# Patient Record
Sex: Male | Born: 1980 | Race: White | Hispanic: No | Marital: Married | Smoking: Former smoker
Health system: Southern US, Community
[De-identification: ages and names within clinical notes are randomized; demographics above are authoritative.]

## PROBLEM LIST (undated history)

## (undated) DIAGNOSIS — T4145XA Adverse effect of unspecified anesthetic, initial encounter: Secondary | ICD-10-CM

## (undated) DIAGNOSIS — F329 Major depressive disorder, single episode, unspecified: Secondary | ICD-10-CM

## (undated) DIAGNOSIS — G43909 Migraine, unspecified, not intractable, without status migrainosus: Secondary | ICD-10-CM

## (undated) DIAGNOSIS — F32A Depression, unspecified: Secondary | ICD-10-CM

## (undated) DIAGNOSIS — R6889 Other general symptoms and signs: Secondary | ICD-10-CM

## (undated) DIAGNOSIS — Z87820 Personal history of traumatic brain injury: Secondary | ICD-10-CM

## (undated) DIAGNOSIS — K4090 Unilateral inguinal hernia, without obstruction or gangrene, not specified as recurrent: Secondary | ICD-10-CM

## (undated) DIAGNOSIS — K219 Gastro-esophageal reflux disease without esophagitis: Secondary | ICD-10-CM

## (undated) DIAGNOSIS — F419 Anxiety disorder, unspecified: Secondary | ICD-10-CM

## (undated) DIAGNOSIS — M1990 Unspecified osteoarthritis, unspecified site: Secondary | ICD-10-CM

## (undated) DIAGNOSIS — I10 Essential (primary) hypertension: Secondary | ICD-10-CM

## (undated) DIAGNOSIS — G47419 Narcolepsy without cataplexy: Secondary | ICD-10-CM

## (undated) DIAGNOSIS — T8859XA Other complications of anesthesia, initial encounter: Secondary | ICD-10-CM

## (undated) HISTORY — DX: Gastro-esophageal reflux disease without esophagitis: K21.9

---

## 2003-01-30 ENCOUNTER — Emergency Department (HOSPITAL_COMMUNITY): Admission: EM | Admit: 2003-01-30 | Discharge: 2003-01-31 | Payer: Self-pay | Admitting: Emergency Medicine

## 2003-05-16 ENCOUNTER — Emergency Department (HOSPITAL_COMMUNITY): Admission: EM | Admit: 2003-05-16 | Discharge: 2003-05-16 | Payer: Self-pay | Admitting: Emergency Medicine

## 2004-02-21 ENCOUNTER — Emergency Department (HOSPITAL_COMMUNITY): Admission: EM | Admit: 2004-02-21 | Discharge: 2004-02-21 | Payer: Self-pay | Admitting: Emergency Medicine

## 2004-03-14 ENCOUNTER — Emergency Department (HOSPITAL_COMMUNITY): Admission: EM | Admit: 2004-03-14 | Discharge: 2004-03-15 | Payer: Self-pay | Admitting: Emergency Medicine

## 2004-03-16 ENCOUNTER — Emergency Department (HOSPITAL_COMMUNITY): Admission: EM | Admit: 2004-03-16 | Discharge: 2004-03-16 | Payer: Self-pay | Admitting: Emergency Medicine

## 2004-06-09 ENCOUNTER — Emergency Department (HOSPITAL_COMMUNITY): Admission: EM | Admit: 2004-06-09 | Discharge: 2004-06-09 | Payer: Self-pay | Admitting: Emergency Medicine

## 2006-06-15 HISTORY — PX: CIRCUMCISION: SUR203

## 2007-06-16 HISTORY — PX: UVULOPALATOPHARYNGOPLASTY: SHX827

## 2008-06-15 HISTORY — PX: ANKLE SURGERY: SHX546

## 2014-02-21 ENCOUNTER — Encounter (HOSPITAL_COMMUNITY): Payer: Self-pay | Admitting: Emergency Medicine

## 2014-02-21 ENCOUNTER — Emergency Department (HOSPITAL_COMMUNITY)
Admission: EM | Admit: 2014-02-21 | Discharge: 2014-02-21 | Disposition: A | Discharge status: Home / Self Care | Attending: Emergency Medicine | Admitting: Emergency Medicine

## 2014-02-21 DIAGNOSIS — Z8659 Personal history of other mental and behavioral disorders: Secondary | ICD-10-CM | POA: Insufficient documentation

## 2014-02-21 DIAGNOSIS — J029 Acute pharyngitis, unspecified: Secondary | ICD-10-CM | POA: Diagnosis not present

## 2014-02-21 DIAGNOSIS — I10 Essential (primary) hypertension: Secondary | ICD-10-CM | POA: Insufficient documentation

## 2014-02-21 DIAGNOSIS — Z8669 Personal history of other diseases of the nervous system and sense organs: Secondary | ICD-10-CM | POA: Diagnosis not present

## 2014-02-21 DIAGNOSIS — R6883 Chills (without fever): Secondary | ICD-10-CM | POA: Insufficient documentation

## 2014-02-21 DIAGNOSIS — J069 Acute upper respiratory infection, unspecified: Secondary | ICD-10-CM | POA: Insufficient documentation

## 2014-02-21 DIAGNOSIS — R51 Headache: Secondary | ICD-10-CM | POA: Insufficient documentation

## 2014-02-21 DIAGNOSIS — B9789 Other viral agents as the cause of diseases classified elsewhere: Secondary | ICD-10-CM

## 2014-02-21 HISTORY — DX: Anxiety disorder, unspecified: F41.9

## 2014-02-21 HISTORY — DX: Major depressive disorder, single episode, unspecified: F32.9

## 2014-02-21 HISTORY — DX: Narcolepsy without cataplexy: G47.419

## 2014-02-21 HISTORY — DX: Essential (primary) hypertension: I10

## 2014-02-21 HISTORY — DX: Depression, unspecified: F32.A

## 2014-02-21 LAB — RAPID STREP SCREEN (MED CTR MEBANE ONLY): Streptococcus, Group A Screen (Direct): NEGATIVE

## 2014-02-21 MED ORDER — CETIRIZINE HCL 10 MG PO CAPS: 10.0000 mg | ORAL_CAPSULE | Freq: Every evening | ORAL | Status: DC | PRN

## 2014-02-21 NOTE — ED Provider Notes (Signed)
CSN: [id number]     Arrival date & time 02/21/14  0944 History   First MD Initiated Contact with Patient 02/21/14 1000     Chief Complaint  Patient presents with  . Nasal Congestion  . Sore Throat   Patient is a 33 y.o. male presenting with pharyngitis. The history is provided by the patient. No language interpreter was used.  Sore Throat This is a new problem. The current episode started yesterday. The problem occurs constantly. The problem has been unchanged. Associated symptoms include chills, congestion, coughing, headaches, a sore throat and weakness. Pertinent negatives include no abdominal pain, anorexia, arthralgias, change in bowel habit, chest pain, diaphoresis, fatigue, fever, joint swelling, myalgias, nausea, neck pain, numbness, rash, swollen glands, urinary symptoms, vertigo, visual change or vomiting. Nothing aggravates the symptoms. He has tried nothing for the symptoms. The treatment provided no relief.    Past Medical History  Diagnosis Date  . Hypertension   . Narcolepsy   . Anxiety   . Depression    Past Surgical History  Procedure Laterality Date  . Tonsillectomy    . Ankle surgery     No family history on file. History  Substance Use Topics  . Smoking status: Never Smoker   . Smokeless tobacco: Current User  . Alcohol Use: No    Review of Systems  Constitutional: Positive for chills. Negative for fever, diaphoresis and fatigue.  HENT: Positive for congestion and sore throat. Negative for ear discharge, ear pain and facial swelling.   Respiratory: Positive for cough.   Cardiovascular: Negative for chest pain.  Gastrointestinal: Negative for nausea, vomiting, abdominal pain, anorexia and change in bowel habit.  Musculoskeletal: Negative for arthralgias, joint swelling, myalgias and neck pain.  Skin: Negative for rash.  Neurological: Positive for weakness and headaches. Negative for vertigo and numbness.  All other systems reviewed and are  negative.     Allergies  Review of patient's allergies indicates no known allergies.  Home Medications   Prior to Admission medications   Medication Sig Start Date End Date Taking? Authorizing Provider  Cetirizine HCl (ZYRTEC ALLERGY) 10 MG CAPS Take 1 capsule (10 mg total) by mouth at bedtime as needed (for congestion). 02/21/14   Magdalyn Arenivas A Forcucci, PA-C   BP 132/80  Pulse 64  Temp(Src) 98.1 F (36.7 C) (Oral)  Resp 18  SpO2 100% Physical Exam  Nursing note and vitals reviewed. Constitutional: He is oriented to person, place, and time. He appears well-developed and well-nourished. No distress.  HENT:  Head: Normocephalic and atraumatic.  Mouth/Throat: Uvula is midline and mucous membranes are normal. No oral lesions. No trismus in the jaw. No uvula swelling. Posterior oropharyngeal erythema present. No oropharyngeal exudate, posterior oropharyngeal edema or tonsillar abscesses.  Prior tonsilectomy  Eyes: Conjunctivae and EOM are normal. Pupils are equal, round, and reactive to light. No scleral icterus.  Neck: Normal range of motion. Neck supple. No JVD present. No thyromegaly present.  Cardiovascular: Normal rate, regular rhythm, normal heart sounds and intact distal pulses.  Exam reveals no gallop and no friction rub.   No murmur heard. Pulmonary/Chest: Breath sounds normal. No respiratory distress. He has no wheezes. He has no rales. He exhibits no tenderness.  Musculoskeletal: Normal range of motion.  Lymphadenopathy:    He has no cervical adenopathy.  Neurological: He is alert and oriented to person, place, and time.  Skin: Skin is warm and dry. He is not diaphoretic.  Psychiatric: He has a normal mood and affect. His  behavior is normal. Judgment and thought content normal.    ED Course  Procedures (including critical care time) Labs Review Labs Reviewed  RAPID STREP SCREEN  CULTURE, GROUP A STREP    Imaging Review No results found.   EKG  Interpretation None      MDM   Final diagnoses:  Viral URI with cough  Pharyngitis   Patient is a 33 y.o. Male who presents to the ED with sore throat, nasal congestion and cough.  Physical examination unremarkable at this time and lungs are clear to auscultation.  Patient is afebrile.  Doubt PNA at this time.  Suspect this is viral URI with cough at this time.  Will treat symptomatically with saline in the nose, tylenol or ibuprofen as needed for sore throat and myalgias and will given prescription for zyrtec to help with congestion.  Patient to return for PNA symptoms.  Patient is stable for discharge at this time.  Patient states understanding and agreement.      Eben Burow, PA-C 02/21/14 1035

## 2014-02-21 NOTE — ED Notes (Signed)
Per pt, states nasal congestion, sore throat since last night

## 2014-02-21 NOTE — ED Provider Notes (Signed)
Medical screening examination/treatment/procedure(s) were performed by non-physician practitioner and as supervising physician I was immediately available for consultation/collaboration.  Audree Camel, MD 02/21/14 [phone]

## 2014-02-21 NOTE — Discharge Instructions (Signed)
Upper Respiratory Infection, Adult An upper respiratory infection (URI) is also known as the common cold. It is often caused by a type of germ (virus). Colds are easily spread (contagious). You can pass it to others by kissing, coughing, sneezing, or drinking out of the same glass. Usually, you get better in 1 or 2 weeks.  HOME CARE   Only take medicine as told by your doctor.  Use a warm mist humidifier or breathe in steam from a hot shower.  Drink enough water and fluids to keep your pee (urine) clear or pale yellow.  Get plenty of rest.  Return to work when your temperature is back to normal or as told by your doctor. You may use a face mask and wash your hands to stop your cold from spreading. GET HELP RIGHT AWAY IF:   After the first few days, you feel you are getting worse.  You have questions about your medicine.  You have chills, shortness of breath, or brown or red spit (mucus).  You have yellow or brown snot (nasal discharge) or pain in the face, especially when you bend forward.  You have a fever, puffy (swollen) neck, pain when you swallow, or white spots in the back of your throat.  You have a bad headache, ear pain, sinus pain, or chest pain.  You have a high-pitched whistling sound when you breathe in and out (wheezing).  You have a lasting cough or cough up blood.  You have sore muscles or a stiff neck. MAKE SURE YOU:   Understand these instructions.  Will watch your condition.  Will get help right away if you are not doing well or get worse. Document Released: 11/18/2007 Document Revised: 08/24/2011 Document Reviewed: 09/06/2013 Exit[employer] Patient Information 2015 [employer]. This information is not intended to replace advice given to you by your health care provider. Make sure you discuss any questions you have with your health care provider.  

## 2014-02-23 LAB — CULTURE, GROUP A STREP

## 2014-03-08 ENCOUNTER — Emergency Department (HOSPITAL_COMMUNITY)
Admission: EM | Admit: 2014-03-08 | Discharge: 2014-03-08 | Disposition: A | Discharge status: Home / Self Care | Attending: Emergency Medicine | Admitting: Emergency Medicine

## 2014-03-08 ENCOUNTER — Encounter (HOSPITAL_COMMUNITY): Payer: Self-pay | Admitting: Emergency Medicine

## 2014-03-08 ENCOUNTER — Emergency Department (HOSPITAL_COMMUNITY)

## 2014-03-08 DIAGNOSIS — F329 Major depressive disorder, single episode, unspecified: Secondary | ICD-10-CM | POA: Insufficient documentation

## 2014-03-08 DIAGNOSIS — K2990 Gastroduodenitis, unspecified, without bleeding: Secondary | ICD-10-CM | POA: Diagnosis not present

## 2014-03-08 DIAGNOSIS — K2970 Gastritis, unspecified, without bleeding: Secondary | ICD-10-CM

## 2014-03-08 DIAGNOSIS — Z79899 Other long term (current) drug therapy: Secondary | ICD-10-CM | POA: Insufficient documentation

## 2014-03-08 DIAGNOSIS — I10 Essential (primary) hypertension: Secondary | ICD-10-CM | POA: Insufficient documentation

## 2014-03-08 DIAGNOSIS — F3289 Other specified depressive episodes: Secondary | ICD-10-CM | POA: Diagnosis not present

## 2014-03-08 DIAGNOSIS — F411 Generalized anxiety disorder: Secondary | ICD-10-CM | POA: Diagnosis not present

## 2014-03-08 DIAGNOSIS — Z8669 Personal history of other diseases of the nervous system and sense organs: Secondary | ICD-10-CM | POA: Diagnosis not present

## 2014-03-08 DIAGNOSIS — R1013 Epigastric pain: Secondary | ICD-10-CM | POA: Diagnosis present

## 2014-03-08 LAB — COMPREHENSIVE METABOLIC PANEL
ALBUMIN: 3.9 g/dL (ref 3.5–5.2)
ALK PHOS: 133 U/L — AB (ref 39–117)
ALT: 50 U/L (ref 0–53)
ANION GAP: 8 (ref 5–15)
AST: 32 U/L (ref 0–37)
BUN: 15 mg/dL (ref 6–23)
CALCIUM: 8.8 mg/dL (ref 8.4–10.5)
CO2: 30 mEq/L (ref 19–32)
Chloride: 102 mEq/L (ref 96–112)
Creatinine, Ser: 1.25 mg/dL (ref 0.50–1.35)
GFR calc non Af Amer: 75 mL/min — ABNORMAL LOW (ref >90)
GFR, EST AFRICAN AMERICAN: 87 mL/min — AB (ref >90)
Glucose, Bld: 98 mg/dL (ref 70–99)
Potassium: 4.4 mEq/L (ref 3.7–5.3)
SODIUM: 140 meq/L (ref 137–147)
Total Bilirubin: <0.2 mg/dL — ABNORMAL LOW (ref 0.3–1.2)
Total Protein: 7.1 g/dL (ref 6.0–8.3)

## 2014-03-08 LAB — CBC WITH DIFFERENTIAL/PLATELET
BASOS ABS: 0 10*3/uL (ref 0.0–0.1)
Basophils Relative: 0 % (ref 0–1)
EOS PCT: 0 % (ref 0–5)
Eosinophils Absolute: 0 10*3/uL (ref 0.0–0.7)
HCT: 46.6 % (ref 39.0–52.0)
HEMOGLOBIN: 16 g/dL (ref 13.0–17.0)
LYMPHS ABS: 3.1 10*3/uL (ref 0.7–4.0)
Lymphocytes Relative: 42 % (ref 12–46)
MCH: 30.9 pg (ref 26.0–34.0)
MCHC: 34.3 g/dL (ref 30.0–36.0)
MCV: 90 fL (ref 78.0–100.0)
MONOS PCT: 8 % (ref 3–12)
Monocytes Absolute: 0.6 10*3/uL (ref 0.1–1.0)
NEUTROS PCT: 50 % (ref 43–77)
Neutro Abs: 3.7 10*3/uL (ref 1.7–7.7)
PLATELETS: 194 10*3/uL (ref 150–400)
RBC: 5.18 MIL/uL (ref 4.22–5.81)
RDW: 12.3 % (ref 11.5–15.5)
WBC: 7.3 10*3/uL (ref 4.0–10.5)

## 2014-03-08 LAB — LIPASE, BLOOD: LIPASE: 21 U/L (ref 11–59)

## 2014-03-08 LAB — I-STAT TROPONIN, ED: TROPONIN I, POC: 0.01 ng/mL (ref 0.00–0.08)

## 2014-03-08 MED ORDER — ONDANSETRON 4 MG PO TBDP
8.0000 mg | ORAL_TABLET | Freq: Once | ORAL | Status: AC
  Administered 2014-03-08: 8 mg via ORAL
  Filled 2014-03-08: qty 2

## 2014-03-08 MED ORDER — OXYCODONE-ACETAMINOPHEN 5-325 MG PO TABS
1.0000 | ORAL_TABLET | Freq: Once | ORAL | Status: AC
  Administered 2014-03-08: 1 via ORAL
  Filled 2014-03-08: qty 1

## 2014-03-08 MED ORDER — HYDROMORPHONE HCL 1 MG/ML IJ SOLN
1.0000 mg | Freq: Once | INTRAMUSCULAR | Status: AC
  Administered 2014-03-08: 1 mg via INTRAVENOUS
  Filled 2014-03-08: qty 1

## 2014-03-08 MED ORDER — RANITIDINE HCL 150 MG PO TABS: 150.0000 mg | ORAL_TABLET | Freq: Two times a day (BID) | ORAL | Status: DC

## 2014-03-08 MED ORDER — OMEPRAZOLE 20 MG PO CPDR: 20.0000 mg | DELAYED_RELEASE_CAPSULE | Freq: Every day | ORAL | Status: DC

## 2014-03-08 MED ORDER — ONDANSETRON HCL 4 MG/2ML IJ SOLN
4.0000 mg | Freq: Once | INTRAMUSCULAR | Status: AC
  Administered 2014-03-08: 4 mg via INTRAVENOUS
  Filled 2014-03-08: qty 2

## 2014-03-08 MED ORDER — HYDROCODONE-ACETAMINOPHEN 5-325 MG PO TABS: 1.0000 | ORAL_TABLET | Freq: Four times a day (QID) | ORAL | Status: DC | PRN

## 2014-03-08 NOTE — Discharge Instructions (Signed)

## 2014-03-08 NOTE — ED Notes (Signed)
Pt c/o gallbladder attack, sts last time he was seen for they told him if it happened again he needs to get it removed. Pt c/o pain to RUQ abd and nausea. Denies v/d. Nad, skin warm and dry, resp e/u.

## 2014-03-08 NOTE — ED Notes (Signed)
Asked pt to provide urine specimen pt stated he could not provide one at this time.  

## 2014-03-08 NOTE — ED Provider Notes (Signed)
CSN: [id number]     Arrival date & time 03/08/14  1722 History   First MD Initiated Contact with Patient 03/08/14 2016     No chief complaint on file.    (Consider location/radiation/quality/duration/timing/severity/associated sxs/prior Treatment) Patient is a 33 y.o. male presenting with abdominal pain. The history is provided by the patient.  Abdominal Pain Pain location:  Epigastric and RUQ Pain quality: aching and fullness   Pain radiates to:  Does not radiate Pain severity:  Mild Onset quality:  Gradual Duration:  4 hours Timing:  Constant Progression:  Unchanged Chronicity:  Recurrent (hx of biliary colic in past, states feels similar) Context: eating (pt staes started after he ate some spicy sausage for dinner)   Relieved by:  Nothing Worsened by:  Nothing tried Ineffective treatments:  None tried Associated symptoms: nausea   Associated symptoms: no anorexia, no belching, no chest pain, no constipation, no cough, no diarrhea, no dysuria, no fever, no hematemesis, no hematochezia, no hematuria, no melena, no shortness of breath and no vomiting   Risk factors: obesity   Risk factors: no alcohol abuse, not elderly and has not had multiple surgeries     Past Medical History  Diagnosis Date  . Hypertension   . Narcolepsy   . Anxiety   . Depression    Past Surgical History  Procedure Laterality Date  . Tonsillectomy    . Ankle surgery     No family history on file. History  Substance Use Topics  . Smoking status: Never Smoker   . Smokeless tobacco: Current User  . Alcohol Use: No    Review of Systems  Constitutional: Negative for fever, activity change and appetite change.  HENT: Negative for congestion and rhinorrhea.   Eyes: Negative for discharge and itching.  Respiratory: Negative for cough, shortness of breath and wheezing.   Cardiovascular: Negative for chest pain.  Gastrointestinal: Positive for nausea and abdominal pain. Negative for vomiting,  diarrhea, constipation, melena, hematochezia, anorexia and hematemesis.  Genitourinary: Negative for dysuria, hematuria, decreased urine volume and difficulty urinating.  Skin: Negative for rash and wound.  Neurological: Negative for syncope, weakness and numbness.  All other systems reviewed and are negative.     Allergies  Review of patient's allergies indicates no known allergies.  Home Medications   Prior to Admission medications   Medication Sig Start Date End Date Taking? Authorizing Provider  methylphenidate 27 MG PO CR tablet Take 27 mg by mouth 2 (two) times daily.   Yes Historical Provider, MD  propranolol (INDERAL) 10 MG tablet Take 10 mg by mouth 3 (three) times daily.   Yes Historical Provider, MD  Sodium Oxybate (XYREM) 500 MG/ML SOLN Take 3,750 mg by mouth at bedtime.    Yes Historical Provider, MD  telmisartan-hydrochlorothiazide (MICARDIS HCT) 40-12.5 MG per tablet Take 1 tablet by mouth every morning.   Yes Historical Provider, MD  venlafaxine XR (EFFEXOR-XR) 75 MG 24 hr capsule Take 75 mg by mouth daily with breakfast.   Yes Historical Provider, MD  HYDROcodone-acetaminophen (NORCO) 5-325 MG per tablet Take 1 tablet by mouth every 6 (six) hours as needed for moderate pain. 03/08/14   Pilar Jarvis, MD  methylphenidate (RITALIN) 5 MG tablet Take 5 mg by mouth 3 (three) times daily as needed (For narcolepsy.).     Historical Provider, MD  omeprazole (PRILOSEC) 20 MG capsule Take 1 capsule (20 mg total) by mouth daily. 03/08/14   Pilar Jarvis, MD  ranitidine (ZANTAC) 150 MG tablet Take 1  tablet (150 mg total) by mouth 2 (two) times daily. 03/08/14   Pilar Jarvis, MD   BP 128/77  Pulse 66  Temp(Src) 98.2 F (36.8 C) (Oral)  Resp 18  Ht  (1.88 m)  Wt 240 lb (108.863 kg)  BMI 30.80 kg/m2  SpO2 95% Physical Exam  Vitals reviewed. Constitutional: He is oriented to person, place, and time. He appears well-developed and well-nourished. No distress.  HENT:  Head:  Normocephalic and atraumatic.  Mouth/Throat: Oropharynx is clear and moist. No oropharyngeal exudate.  Eyes: Conjunctivae and EOM are normal. Pupils are equal, round, and reactive to light. Right eye exhibits no discharge. Left eye exhibits no discharge. No scleral icterus.  Neck: Normal range of motion. Neck supple.  Cardiovascular: Normal rate, regular rhythm, normal heart sounds and intact distal pulses.  Exam reveals no gallop and no friction rub.   No murmur heard. Pulmonary/Chest: Effort normal and breath sounds normal. No respiratory distress. He has no wheezes. He has no rales.  Abdominal: Soft. He exhibits no distension and no mass. There is tenderness (RUQ ttp, neg murphy, no peritonitis, no lower abd ttp).  Musculoskeletal: Normal range of motion.  Neurological: He is alert and oriented to person, place, and time. No cranial nerve deficit. He exhibits normal muscle tone. Coordination normal.  Skin: Skin is warm. No rash noted. He is not diaphoretic.    ED Course  Procedures (including critical care time) Labs Review Labs Reviewed  COMPREHENSIVE METABOLIC PANEL - Abnormal; Notable for the following:    Alkaline Phosphatase 133 (*)    Total Bilirubin <0.2 (*)    GFR calc non Af Amer 75 (*)    GFR calc Af Amer 87 (*)    All other components within normal limits  CBC WITH DIFFERENTIAL  LIPASE, BLOOD  URINALYSIS, ROUTINE W REFLEX MICROSCOPIC  I-STAT TROPOININ, ED    Imaging Review US Abdomen Limited  03/08/2014   CLINICAL DATA:  Right upper quadrant pain.  EXAM: US ABDOMEN LIMITED - RIGHT UPPER QUADRANT  COMPARISON:  None.  FINDINGS: Gallbladder:  No gallstones or wall thickening visualized. No sonographic Murphy sign noted.  Common bile duct:  Diameter: 4.3 mm  Liver:  No focal lesion identified. Diffusely increased echogenicity present, suggestive of steatosis.  IMPRESSION: 1. Normal sonographic evaluation of the gallbladder. 2. Hepatic steatosis.   Electronically Signed   By:  Rise Mu M.D.   On: 03/08/2014 22:50     EKG Interpretation None      MDM   MDM: 33 y.o. WM w/ PMHx of HTN, gallstones w/ cc: of abd pain. Started just PTA after eating sausage. Hx of similar pain with his biliary colic. No fever. Nausea, no vomiting, no diarrhea. Here AFVSS, well appearing. NAD. Abd soft, but RUQ TTP without peritonitis. No lower abd ttp. Story c/w biliary dz. Will check RUQ u/s. Labs WNL. No fever. Not clinicially cholecystitis. Pain meds given. Will check u/s. U/s no evidence of stones or cholecystitis. Pt states he has hx of stones, but none visualized today. Possibly passed. No signs of choledocolithiasis on labs. Likely gastritis. Will treat with Zantac and Omeprazole. Rx given. Short course of Norco as well. Pt became upset when told his results. He states he is in unbearable pain. Of note, his HR is 60, he is resting comfortably, talking to calmly while laying on the bed. I explained to him that his test and presentation today are not indicative of a life threatening emergency. I explained my  wish to treat him with H2 blockers and PPIs with a short course of Norco. I explained that he may need endoscopy at a later time if sxs persist. He told me to "fuckin call someone in tonight to do it" which I explained would not be an option. He told me that we "fuckin wasted his time" stating we weren't doing anything for him. I answered all his questions to the best of my ability but explained that we would use medical therapy and counseled him on decreasing risk factors, including smoking, EtOH, and food triggers. Discharged.   Final diagnoses:  Gastritis   New Prescriptions   HYDROCODONE-ACETAMINOPHEN (NORCO) 5-325 MG PER TABLET    Take 1 tablet by mouth every 6 (six) hours as needed for moderate pain.   OMEPRAZOLE (PRILOSEC) 20 MG CAPSULE    Take 1 capsule (20 mg total) by mouth daily.   RANITIDINE (ZANTAC) 150 MG TABLET    Take 1 tablet (150 mg total) by mouth 2 (two)  times daily.   [facility] EMERGENCY DEPARTMENT [id number] [place] [address] [phone] Schedule an appointment as soon as possible for a visit As needed  Discharged   Pilar Jarvis, MD 03/08/14 2952  Pilar Jarvis, MD 03/08/14 [phone]

## 2014-03-10 NOTE — ED Provider Notes (Signed)
I saw and evaluated the patient, reviewed the resident's note and I agree with the findings and plan.   EKG Interpretation None      Ronald Carpenter is a 33 y.o. male hx of gallstones here with RUQ pain after eating sausage. Mild RUQ tenderness. No vomiting. I was unable to visualize gallbladder on bedside US. Labs showed slightly elevated Alk phos. US showed no obvious gallstones. Likely gastritis. Resident explained it to him and we set up him with GI. He was unhappy with the diagnosis but is stable for d/c.    Wandra Arthurs, MD 03/10/14 [phone]

## 2014-03-15 HISTORY — PX: LAPAROSCOPIC CHOLECYSTECTOMY: SUR755

## 2015-01-28 ENCOUNTER — Emergency Department (HOSPITAL_COMMUNITY)
Admission: EM | Admit: 2015-01-28 | Discharge: 2015-01-28 | Disposition: A | Discharge status: Home / Self Care | Attending: Emergency Medicine | Admitting: Emergency Medicine

## 2015-01-28 ENCOUNTER — Encounter (HOSPITAL_COMMUNITY): Payer: Self-pay | Admitting: Emergency Medicine

## 2015-01-28 ENCOUNTER — Emergency Department (HOSPITAL_COMMUNITY)

## 2015-01-28 DIAGNOSIS — F329 Major depressive disorder, single episode, unspecified: Secondary | ICD-10-CM | POA: Diagnosis not present

## 2015-01-28 DIAGNOSIS — W2209XA Striking against other stationary object, initial encounter: Secondary | ICD-10-CM | POA: Diagnosis not present

## 2015-01-28 DIAGNOSIS — Y9289 Other specified places as the place of occurrence of the external cause: Secondary | ICD-10-CM | POA: Insufficient documentation

## 2015-01-28 DIAGNOSIS — Z79899 Other long term (current) drug therapy: Secondary | ICD-10-CM | POA: Diagnosis not present

## 2015-01-28 DIAGNOSIS — S299XXA Unspecified injury of thorax, initial encounter: Secondary | ICD-10-CM | POA: Diagnosis present

## 2015-01-28 DIAGNOSIS — F419 Anxiety disorder, unspecified: Secondary | ICD-10-CM | POA: Insufficient documentation

## 2015-01-28 DIAGNOSIS — S20211A Contusion of right front wall of thorax, initial encounter: Secondary | ICD-10-CM | POA: Diagnosis not present

## 2015-01-28 DIAGNOSIS — I10 Essential (primary) hypertension: Secondary | ICD-10-CM | POA: Insufficient documentation

## 2015-01-28 DIAGNOSIS — Z8669 Personal history of other diseases of the nervous system and sense organs: Secondary | ICD-10-CM | POA: Insufficient documentation

## 2015-01-28 DIAGNOSIS — Y9389 Activity, other specified: Secondary | ICD-10-CM | POA: Diagnosis not present

## 2015-01-28 DIAGNOSIS — Y998 Other external cause status: Secondary | ICD-10-CM | POA: Diagnosis not present

## 2015-01-28 NOTE — ED Notes (Signed)
Was moving furniture, fell, the dresser corner landed on the right side of pt chest. Pt states the only problem he has with breathing is taking a deep breath because it hurts to do so. Lung sounds clear in all fields. No obvious bruising/swelling/signs of bleeding to anterior chest/back/stomach.

## 2015-01-28 NOTE — Discharge Instructions (Signed)
Lease follow-up with your primary care provider for further evaluation and management. Please monitor for new or worsening signs or symptoms.Please use ibuprofen or Tylenol as needed for the pain. Please use ice

## 2015-01-28 NOTE — ED Provider Notes (Signed)
CSN: [id number]     Arrival date & time 01/28/15  1357 History  This chart was scribed for non-physician practitioner, Eyvonne Mechanic, PA-C, working with Raeford Razor, MD by Charline Bills, ED Scribe. This patient was seen in room WTR6/WTR6 and the patient's care was started at 3:00 PM.   Chief Complaint  Patient presents with  . Chest Injury   The history is provided by the patient. No language interpreter was used.   HPI Comments: Ronald Carpenter is a 34 y.o. male, with a h/o HTN, who presents to the Emergency Department complaining of sudden onset of constant right rib pain onset earlier today. 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 Pt reports constant, moderate pain that is exacerbated with palpation, movement, sneezing and deep breaths. He further reports SOB with deep inspiration but none at baseline.    [facility]  Past Medical History  Diagnosis Date  . Hypertension   . Narcolepsy   . Anxiety   . Depression    Past Surgical History  Procedure Laterality Date  . Tonsillectomy    . Ankle surgery     History reviewed. No pertinent family history. Social History  Substance Use Topics  . Smoking status: Never Smoker   . Smokeless tobacco: Current User  . Alcohol Use: No    Review of Systems  All other systems reviewed and are negative.  Allergies  Review of patient's allergies indicates no known allergies.  Home Medications   Prior to Admission medications   Medication Sig Start Date End Date Taking? Authorizing Provider  HYDROcodone-acetaminophen (NORCO) 5-325 MG per tablet Take 1 tablet by mouth every 6 (six) hours as needed for moderate pain. 03/08/14   Pilar Jarvis, MD  methylphenidate (RITALIN) 5 MG tablet Take 5 mg by mouth 3 (three) times daily as needed (For narcolepsy.).     Historical Provider, MD  methylphenidate 27 MG PO CR tablet Take 27 mg by mouth 2 (two) times daily.    Historical Provider, MD   omeprazole (PRILOSEC) 20 MG capsule Take 1 capsule (20 mg total) by mouth daily. 03/08/14   Pilar Jarvis, MD  propranolol (INDERAL) 10 MG tablet Take 10 mg by mouth 3 (three) times daily.    Historical Provider, MD  ranitidine (ZANTAC) 150 MG tablet Take 1 tablet (150 mg total) by mouth 2 (two) times daily. 03/08/14   Pilar Jarvis, MD  Sodium Oxybate (XYREM) 500 MG/ML SOLN Take 3,750 mg by mouth at bedtime.     Historical Provider, MD  telmisartan-hydrochlorothiazide (MICARDIS HCT) 40-12.5 MG per tablet Take 1 tablet by mouth every morning.    Historical Provider, MD  venlafaxine XR (EFFEXOR-XR) 75 MG 24 hr capsule Take 75 mg by mouth daily with breakfast.    Historical Provider, MD   BP 148/81 mmHg  Pulse 69  Temp(Src) 98.3 F (36.8 C) (Oral)  Resp 18  SpO2 98% Physical Exam  Constitutional: He is oriented to person, place, and time. He appears well-developed and well-nourished. No distress.  No obvious distress. Resting comfortably.   HENT:  Head: Normocephalic and atraumatic.  Eyes: Conjunctivae and EOM are normal.  Neck: Neck supple. No tracheal deviation present.  Cardiovascular: Normal rate, regular rhythm and normal heart sounds.   Pulmonary/Chest: Effort normal and breath sounds normal. No respiratory distress.  No obvious deformity. Tenderness to palpation of the R anterior chest wall and ribs. No crepitus or signs of infection. Lung  sounds clear and present.  Abdominal: Soft. There is no tenderness.  Musculoskeletal: Normal range of motion.  Neurological: He is alert and oriented to person, place, and time.  Skin: Skin is warm and dry.  Psychiatric: He has a normal mood and affect. His behavior is normal.  Nursing note and vitals reviewed.  ED Course  Procedures (including critical care time) 3:05 PM-Discussed treatment plan with pt at bedside and pt agreed to plan.     Labs Review Labs Reviewed - No data to display  Imaging Review Dg Ribs Unilateral W/chest  Right  01/28/2015   CLINICAL DATA:  Status post fall down stairs 5 days ago. Right rib pain. Initial encounter.  EXAM: RIGHT RIBS AND CHEST - 3+ VIEW  COMPARISON:  None.  FINDINGS: The lungs are clear. No pneumothorax or pleural effusion. There is no fracture. Heart size is normal.  IMPRESSION: Negative exam.   Electronically Signed   By: Drusilla Kanner M.D.   On: 01/28/2015 15:27    EKG Interpretation None      MDM   Final diagnoses:  Rib contusion, right, initial encounter    Labs:  Imaging: DG Ribs Unilateral w/Chest Right- no significant findings  Consults:  Therapeutics:  Discharge Meds:   Assessment/Plan:patient presents with rib pain after reportedly having a dresser landed on his ribs. He has no obvious signs of trauma to the ribs, lung sounds are clear, x-ray showed no fracture. Oxygen at 98%, full lung expansion. He will be instructedto use ibuprofen or Tylenol as needed for pain, monitor for new or worsening signs or symptoms, return immediately if any present. Patient given strict return precautions, instructed follow-up with his primary care provider for further evaluation if symptoms persist.  I personally performed the services described in this documentation, which was scribed in my presence. The recorded information has been reviewed and is accurate.  Eyvonne Mechanic, PA-C 01/28/15 1850  Raeford Razor, MD 01/31/15 [phone]

## 2015-02-06 ENCOUNTER — Encounter (HOSPITAL_COMMUNITY): Payer: Self-pay | Admitting: Emergency Medicine

## 2015-02-06 ENCOUNTER — Emergency Department (HOSPITAL_COMMUNITY)
Admission: EM | Admit: 2015-02-06 | Discharge: 2015-02-06 | Disposition: A | Discharge status: Home / Self Care | Attending: Emergency Medicine | Admitting: Emergency Medicine

## 2015-02-06 DIAGNOSIS — I10 Essential (primary) hypertension: Secondary | ICD-10-CM | POA: Insufficient documentation

## 2015-02-06 DIAGNOSIS — Y9389 Activity, other specified: Secondary | ICD-10-CM | POA: Diagnosis not present

## 2015-02-06 DIAGNOSIS — Y998 Other external cause status: Secondary | ICD-10-CM | POA: Diagnosis not present

## 2015-02-06 DIAGNOSIS — F419 Anxiety disorder, unspecified: Secondary | ICD-10-CM | POA: Diagnosis not present

## 2015-02-06 DIAGNOSIS — S3992XA Unspecified injury of lower back, initial encounter: Secondary | ICD-10-CM | POA: Diagnosis not present

## 2015-02-06 DIAGNOSIS — Z8619 Personal history of other infectious and parasitic diseases: Secondary | ICD-10-CM | POA: Insufficient documentation

## 2015-02-06 DIAGNOSIS — S59911A Unspecified injury of right forearm, initial encounter: Secondary | ICD-10-CM | POA: Diagnosis not present

## 2015-02-06 DIAGNOSIS — Y92481 Parking lot as the place of occurrence of the external cause: Secondary | ICD-10-CM | POA: Diagnosis not present

## 2015-02-06 DIAGNOSIS — F329 Major depressive disorder, single episode, unspecified: Secondary | ICD-10-CM | POA: Insufficient documentation

## 2015-02-06 DIAGNOSIS — Z79899 Other long term (current) drug therapy: Secondary | ICD-10-CM | POA: Insufficient documentation

## 2015-02-06 DIAGNOSIS — M5489 Other dorsalgia: Secondary | ICD-10-CM

## 2015-02-06 DIAGNOSIS — M79601 Pain in right arm: Secondary | ICD-10-CM

## 2015-02-06 MED ORDER — IBUPROFEN 800 MG PO TABS: 800.0000 mg | ORAL_TABLET | Freq: Three times a day (TID) | ORAL | Status: DC | PRN

## 2015-02-06 MED ORDER — CYCLOBENZAPRINE HCL 10 MG PO TABS: 10.0000 mg | ORAL_TABLET | Freq: Three times a day (TID) | ORAL | Status: DC | PRN
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
## 2015-02-28 ENCOUNTER — Ambulatory Visit (INDEPENDENT_AMBULATORY_CARE_PROVIDER_SITE_OTHER): Admitting: Family Medicine

## 2015-02-28 VITALS — BP 112/76 | HR 81 | Temp 98.1°F | Resp 18 | Ht 74.0 in | Wt 231.2 lb

## 2015-02-28 DIAGNOSIS — S00411A Abrasion of right ear, initial encounter: Secondary | ICD-10-CM

## 2015-02-28 DIAGNOSIS — H6502 Acute serous otitis media, left ear: Secondary | ICD-10-CM

## 2015-02-28 DIAGNOSIS — H6121 Impacted cerumen, right ear: Secondary | ICD-10-CM

## 2015-02-28 DIAGNOSIS — H9201 Otalgia, right ear: Secondary | ICD-10-CM | POA: Diagnosis not present

## 2015-02-28 MED ORDER — NEOMYCIN-POLYMYXIN-HC 3.5-10000-1 OT SOLN: 4.0000 [drp] | Freq: Three times a day (TID) | OTIC | Status: DC

## 2015-02-28 MED ORDER — AMOXICILLIN 500 MG PO CAPS
500.0000 mg | ORAL_CAPSULE | Freq: Three times a day (TID) | ORAL | Status: DC
Start: 2015-02-28 — End: 2016-01-06

## 2015-02-28 NOTE — Progress Notes (Addendum)
Subjective:  This chart was scribed for Ronald Staggers, MD by Ronald Carpenter, medical scribe at [facility].The patient was seen in exam room 02 and the patient's care was started at 11:56 AM.   Patient ID: Ronald Carpenter, male    DOB: 08/17/1980, 34 y.o.   MRN: [id number] Chief Complaint  Patient presents with  . Ear Pain    right ear, woke up to ear feeling full,tried to clean it and now feels pain   HPI HPI Comments: Ronald Carpenter is a 34 y.o. male who presents to [facility] complaining of right ear pressure with associated hearing loss and pain. Tried cleaning the ear. Sore throat this morning. No fever, congestion, and cough. Works at a [employer], general practice. Does not ear plugs, did not get water in his ear. Traveled to [place] this past Sunday. No ear drops. No hx of ear infections. Served in [employer] for 11 years. Retired last year.  There are no active problems to display for this patient.  Past Medical History  Diagnosis Date  . Hypertension   . Narcolepsy   . Anxiety   . Depression   . GERD (gastroesophageal reflux disease)    Past Surgical History  Procedure Laterality Date  . Tonsillectomy    . Ankle surgery    . Cholecystectomy     No Known Allergies Prior to Admission medications   Medication Sig Start Date End Date Taking? Authorizing Provider  methylphenidate (RITALIN LA) 30 MG 24 hr capsule Take 30 mg by mouth every morning.   Yes Historical Provider, MD  methylphenidate (RITALIN) 5 MG tablet Take 5 mg by mouth 3 (three) times daily as needed (For narcolepsy.).    Yes Historical Provider, MD  propranolol (INDERAL) 10 MG tablet Take 10 mg by mouth 3 (three) times daily.   Yes Historical Provider, MD  telmisartan-hydrochlorothiazide (MICARDIS HCT) 40-12.5 MG per tablet Take 1 tablet by mouth every morning.   Yes Historical Provider, MD  venlafaxine XR (EFFEXOR-XR) 75 MG 24 hr capsule Take 75 mg by mouth daily  with breakfast.   Yes Historical Provider, MD  cyclobenzaprine (FLEXERIL) 10 MG tablet Take 1 tablet (10 mg total) by mouth 3 (three) times daily as needed for muscle spasms. Patient not taking: Reported on 02/28/2015 02/06/15   Trixie Dredge, PA-C  HYDROcodone-acetaminophen [facility]) 5-325 MG per tablet Take 1 tablet by mouth every 6 (six) hours as needed for moderate pain. Patient not taking: Reported on 02/28/2015 03/08/14   Pilar Jarvis, MD  ibuprofen (ADVIL,MOTRIN) 800 MG tablet Take 1 tablet (800 mg total) by mouth every 8 (eight) hours as needed for mild pain or moderate pain. Patient not taking: Reported on 02/28/2015 02/06/15   Trixie Dredge, PA-C  methylphenidate 27 MG PO CR tablet Take 27 mg by mouth 2 (two) times daily.    Historical Provider, MD  omeprazole (PRILOSEC) 20 MG capsule Take 1 capsule (20 mg total) by mouth daily. Patient not taking: Reported on 02/28/2015 03/08/14   Pilar Jarvis, MD  ranitidine (ZANTAC) 150 MG tablet Take 1 tablet (150 mg total) by mouth 2 (two) times daily. Patient not taking: Reported on 02/28/2015 03/08/14   Pilar Jarvis, MD  Sodium Oxybate (XYREM) 500 MG/ML SOLN Take 3,750 mg by mouth at bedtime.     Historical Provider, MD   Social History   Social History  . Marital Status: Married    Spouse Name: N/A  . Number of Children:  N/A  . Years of Education: N/A   Occupational History  . Not on file.   Social History Main Topics  . Smoking status: Never Smoker   . Smokeless tobacco: Current User  . Alcohol Use: No  . Drug Use: No  . Sexual Activity: Not on file   Other Topics Concern  . Not on file   Social History Narrative   Review of Systems  Constitutional: Negative for fever and chills.  HENT: Positive for ear pain, hearing loss and sore throat. Negative for congestion.   Respiratory: Negative for cough.       Objective:  BP 112/76 mmHg  Pulse 81  Temp(Src) 98.1 F (36.7 C) (Oral)  Resp 18  Ht  (1.88 m)  Wt 231 lb 3.2 oz (104.872 kg)   BMI 29.67 kg/m2  SpO2 98% Physical Exam  Constitutional: He is oriented to person, place, and time. He appears well-developed and well-nourished. No distress.  HENT:  Head: Normocephalic and atraumatic.  Left ear: Cerumen partially obstructed to half the canal. TM pearly grey. No pain with ear motion. Right ear: Minimal discomfort with motion of the pinna. Abrasion of the external canal just inside. Cerumen nearly blocking the canal, with slight opening superiorly Minimal erythema of the distal canal without exudate.  Eyes: Pupils are equal, round, and reactive to light.  Neck: Normal range of motion.  Cardiovascular: Normal rate and regular rhythm.   Pulmonary/Chest: Effort normal. No respiratory distress.  Musculoskeletal: Normal range of motion.  Neurological: He is alert and oriented to person, place, and time.  Skin: Skin is warm and dry.  Psychiatric: He has a normal mood and affect. His behavior is normal.  Nursing note and vitals reviewed.   12:24 PM Status post lavage of right ear, previous small area of abrasion noted on the exterior canal with dried blood, otherwise canal appears clear but slightly erythematous. Tympanic membrane erythematous, with some injection superiorly, clear fluid behind the TM inferiorly.       Assessment & Plan:   Ronald Carpenter is a 34 y.o. male Otalgia of right ear - Plan: neomycin-polymyxin-hydrocortisone (CORTISPORIN) otic solution, amoxicillin (AMOXIL) 500 MG capsule  Cerumen impaction, right  Acute serous otitis media of left ear, recurrence not specified - Plan: amoxicillin (AMOXIL) 500 MG capsule  Abrasion of ear canal, right, initial encounter - Plan: neomycin-polymyxin-hydrocortisone (CORTISPORIN) otic solution  Initial cerumen impaction, with secondary irritation/abrasion of canal after home treatment likely. After lavage, canal was clear, except for abraded area. TM was injected/erythematous with some fluid noted. This may be  serous otitis versus early otitis media.  -Cortisporin otic drops for external/canal irritation versus early infection causing pain.  -If pain not improving the next few days, can start amoxicillin 500 mg 3 times a day for otitis media, but suspected serous otitis at this time.  -RTC precautions    Meds ordered this encounter  Medications  . methylphenidate (RITALIN LA) 30 MG 24 hr capsule    Sig: Take 30 mg by mouth every morning.  . neomycin-polymyxin-hydrocortisone (CORTISPORIN) otic solution    Sig: Place 4 drops into the right ear 3 (three) times daily. For 7 days.    Dispense:  10 mL    Refill:  0  . amoxicillin (AMOXIL) 500 MG capsule    Sig: Take 1 capsule (500 mg total) by mouth 3 (three) times daily.    Dispense:  30 capsule    Refill:  0   Patient Instructions  I  suspect that your ear was blocked, then a slight abrasion or irritation of the canal that may have caused the soreness. There is some redness of your eardrum, but this can sometimes be from fluid behind the ear with traveling into the mountains (see below on serous otitis). Occasionally this can be due to a middle ear infection as well. Start the drops for the ear due to the abrasion and possible early infection in the ear canal.  But if your pain is not improving in the next few days, you can start the antibiotics by mouth that were printed for a middle ear infection. If pain persists in the next 3-4 days, return for recheck, sooner if worsening.  Otalgia The most common reason for this in children is an infection of the middle ear. Pain from the middle ear is usually caused by a build-up of fluid and pressure behind the eardrum. Pain from an earache can be sharp, dull, or burning. The pain may be temporary or constant. The middle ear is connected to the nasal passages by a short narrow tube called the Eustachian tube. The Eustachian tube allows fluid to drain out of the middle ear, and helps keep the pressure in your ear  equalized. CAUSES  A cold or allergy can block the Eustachian tube with inflammation and the build-up of secretions. This is especially likely in small children, because their Eustachian tube is shorter and more horizontal. When the Eustachian tube closes, the normal flow of fluid from the middle ear is stopped. Fluid can accumulate and cause stuffiness, pain, hearing loss, and an ear infection if germs start growing in this area. SYMPTOMS  The symptoms of an ear infection may include fever, ear pain, fussiness, increased crying, and irritability. Many children will have temporary and minor hearing loss during and right after an ear infection. Permanent hearing loss is rare, but the risk increases the more infections a child has. Other causes of ear pain include retained water in the outer ear canal from swimming and bathing. Ear pain in adults is less likely to be from an ear infection. Ear pain may be referred from other locations. Referred pain may be from the joint between your jaw and the skull. It may also come from a tooth problem or problems in the neck. Other causes of ear pain include:  A foreign body in the ear.  Outer ear infection.  Sinus infections.  Impacted ear wax.  Ear injury.  Arthritis of the jaw or TMJ problems.  Middle ear infection.  Tooth infections.  Sore throat with pain to the ears. DIAGNOSIS  Your caregiver can usually make the diagnosis by examining you. Sometimes other special studies, including x-rays and lab work may be necessary. TREATMENT   If antibiotics were prescribed, use them as directed and finish them even if you or your child's symptoms seem to be improved.  Sometimes PE tubes are needed in children. These are little plastic tubes which are put into the eardrum during a simple surgical procedure. They allow fluid to drain easier and allow the pressure in the middle ear to equalize. This helps relieve the ear pain caused by pressure  changes. HOME CARE INSTRUCTIONS   Only take over-the-counter or prescription medicines for pain, discomfort, or fever as directed by your caregiver. DO NOT GIVE CHILDREN ASPIRIN because of the association of Reye's Syndrome in children taking aspirin.  Use a cold pack applied to the outer ear for 15-20 minutes, 03-04 times per day or  as needed may reduce pain. Do not apply ice directly to the skin. You may cause frost bite.  Over-the-counter ear drops used as directed may be effective. Your caregiver may sometimes prescribe ear drops.  Resting in an upright position may help reduce pressure in the middle ear and relieve pain.  Ear pain caused by rapidly descending from high altitudes can be relieved by swallowing or chewing gum. Allowing infants to suck on a bottle during airplane travel can help.  Do not smoke in the house or near children. If you are unable to quit smoking, smoke outside.  Control allergies. SEEK IMMEDIATE MEDICAL CARE IF:   You or your child are becoming sicker.  Pain or fever relief is not obtained with medicine.  You or your child's symptoms (pain, fever, or irritability) do not improve within 24 to 48 hours or as instructed.  Severe pain suddenly stops hurting. This may indicate a ruptured eardrum.  You or your children develop new problems such as severe headaches, stiff neck, difficulty swallowing, or swelling of the face or around the ear. Document Released: 01/17/2004 Document Revised: 08/24/2011 Document Reviewed: 05/23/2008 [facility] Patient Information 2015 [place], [place]. This information is not intended to replace advice given to you by your health care provider. Make sure you discuss any questions you have with your health care provider.  Otitis Externa Otitis externa is a bacterial or fungal infection of the outer ear canal. This is the area from the eardrum to the outside of the ear. Otitis externa is sometimes called "swimmer's ear." CAUSES   Possible causes of infection include:  Swimming in dirty water.  Moisture remaining in the ear after swimming or bathing.  Mild injury (trauma) to the ear.  Objects stuck in the ear (foreign body).  Cuts or scrapes (abrasions) on the outside of the ear. SIGNS AND SYMPTOMS  The first symptom of infection is often itching in the ear canal. Later signs and symptoms may include swelling and redness of the ear canal, ear pain, and yellowish-white fluid (pus) coming from the ear. The ear pain may be worse when pulling on the earlobe. DIAGNOSIS  Your health care provider will perform a physical exam. A sample of fluid may be taken from the ear and examined for bacteria or fungi. TREATMENT  Antibiotic ear drops are often given for 10 to 14 days. Treatment may also include pain medicine or corticosteroids to reduce itching and swelling. HOME CARE INSTRUCTIONS   Apply antibiotic ear drops to the ear canal as prescribed by your health care provider.  Take medicines only as directed by your health care provider.  If you have diabetes, follow any additional treatment instructions from your health care provider.  Keep all follow-up visits as directed by your health care provider. PREVENTION   Keep your ear dry. Use the corner of a towel to absorb water out of the ear canal after swimming or bathing.  Avoid scratching or putting objects inside your ear. This can damage the ear canal or remove the protective wax that lines the canal. This makes it easier for bacteria and fungi to grow.  Avoid swimming in lakes, polluted water, or poorly chlorinated pools.  You may use ear drops made of rubbing alcohol and vinegar after swimming. Combine equal parts of white vinegar and alcohol in a bottle. Put 3 or 4 drops into each ear after swimming. SEEK MEDICAL CARE IF:   You have a fever.  Your ear is still red, swollen, painful, or  draining pus after 3 days.  Your redness, swelling, or pain gets  worse.  You have a severe headache.  You have redness, swelling, pain, or tenderness in the area behind your ear. MAKE SURE YOU:   Understand these instructions.  Will watch your condition.  Will get help right away if you are not doing well or get worse. Document Released: 06/01/2005 Document Revised: 10/16/2013 Document Reviewed: 06/18/2011 [facility] Patient Information 2015 [place], [place]. This information is not intended to replace advice given to you by your health care provider. Make sure you discuss any questions you have with your health care provider.  Serous Otitis Media Serous otitis media is fluid in the middle ear space. This space contains the bones for hearing and air. Air in the middle ear space helps to transmit sound.  The air gets there through the eustachian tube. This tube goes from the back of the nose (nasopharynx) to the middle ear space. It keeps the pressure in the middle ear the same as the outside world. It also helps to drain fluid from the middle ear space. CAUSES  Serous otitis media occurs when the eustachian tube gets blocked. Blockage can come from:  Ear infections.  Colds and other upper respiratory infections.  Allergies.  Irritants such as cigarette smoke.  Sudden changes in air pressure (such as descending in an airplane).  Enlarged adenoids.  A mass in the nasopharynx. During colds and upper respiratory infections, the middle ear space can become temporarily filled with fluid. This can happen after an ear infection also. Once the infection clears, the fluid will generally drain out of the ear through the eustachian tube. If it does not, then serous otitis media occurs. SIGNS AND SYMPTOMS   Hearing loss.  A feeling of fullness in the ear, without pain.  Young children may not show any symptoms but may show slight behavioral changes, such as agitation, ear pulling, or crying. DIAGNOSIS  Serous otitis media is diagnosed by an ear exam.  Tests may be done to check on the movement of the eardrum. Hearing exams may also be done. TREATMENT  The fluid most often goes away without treatment. If allergy is the cause, allergy treatment may be helpful. Fluid that persists for several months may require minor surgery. A small tube is placed in the eardrum to:  Drain the fluid.  Restore the air in the middle ear space. In certain situations, antibiotic medicines are used to avoid surgery. Surgery may be done to remove enlarged adenoids (if this is the cause). HOME CARE INSTRUCTIONS   Keep children away from tobacco smoke.  Keep all follow-up visits as directed by your health care provider. SEEK MEDICAL CARE IF:   Your hearing is not better in 3 months.  Your hearing is worse.  You have ear pain.  You have drainage from the ear.  You have dizziness.  You have serous otitis media only in one ear or have any bleeding from your nose (epistaxis).  You notice a lump on your neck. MAKE SURE YOU:  Understand these instructions.   Will watch your condition.   Will get help right away if you are not doing well or get worse.  Document Released: 08/22/2003 Document Revised: 10/16/2013 Document Reviewed: 12/27/2012 [facility] Patient Information 2015 [place], [place]. This information is not intended to replace advice given to you by your health care provider. Make sure you discuss any questions you have with your health care provider.  I personally performed the services described in this documentation, which was scribed in my presence. The recorded information has been reviewed and considered, and addended by me as needed.

## 2015-02-28 NOTE — Patient Instructions (Signed)
I suspect that your ear was blocked, then a slight abrasion or irritation of the canal that may have caused the soreness. There is some redness of your eardrum, but this can sometimes be from fluid behind the ear with traveling into the mountains (see below on serous otitis). Occasionally this can be due to a middle ear infection as well. Start the drops for the ear due to the abrasion and possible early infection in the ear canal.  But if your pain is not improving in the next few days, you can start the antibiotics by mouth that were printed for a middle ear infection. If pain persists in the next 3-4 days, return for recheck, sooner if worsening.  Otalgia The most common reason for this in children is an infection of the middle ear. Pain from the middle ear is usually caused by a build-up of fluid and pressure behind the eardrum. Pain from an earache can be sharp, dull, or burning. The pain may be temporary or constant. The middle ear is connected to the nasal passages by a short narrow tube called the Eustachian tube. The Eustachian tube allows fluid to drain out of the middle ear, and helps keep the pressure in your ear equalized. CAUSES  A cold or allergy can block the Eustachian tube with inflammation and the build-up of secretions. This is especially likely in small children, because their Eustachian tube is shorter and more horizontal. When the Eustachian tube closes, the normal flow of fluid from the middle ear is stopped. Fluid can accumulate and cause stuffiness, pain, hearing loss, and an ear infection if germs start growing in this area. SYMPTOMS  The symptoms of an ear infection may include fever, ear pain, fussiness, increased crying, and irritability. Many children will have temporary and minor hearing loss during and right after an ear infection. Permanent hearing loss is rare, but the risk increases the more infections a child has. Other causes of ear pain include retained water in the  outer ear canal from swimming and bathing. Ear pain in adults is less likely to be from an ear infection. Ear pain may be referred from other locations. Referred pain may be from the joint between your jaw and the skull. It may also come from a tooth problem or problems in the neck. Other causes of ear pain include:  A foreign body in the ear.  Outer ear infection.  Sinus infections.  Impacted ear wax.  Ear injury.  Arthritis of the jaw or TMJ problems.  Middle ear infection.  Tooth infections.  Sore throat with pain to the ears. DIAGNOSIS  Your caregiver can usually make the diagnosis by examining you. Sometimes other special studies, including x-rays and lab work may be necessary. TREATMENT   If antibiotics were prescribed, use them as directed and finish them even if you or your child's symptoms seem to be improved.  Sometimes PE tubes are needed in children. These are little plastic tubes which are put into the eardrum during a simple surgical procedure. They allow fluid to drain easier and allow the pressure in the middle ear to equalize. This helps relieve the ear pain caused by pressure changes. HOME CARE INSTRUCTIONS   Only take over-the-counter or prescription medicines for pain, discomfort, or fever as directed by your caregiver. DO NOT GIVE CHILDREN ASPIRIN because of the association of Reye's Syndrome in children taking aspirin.  Use a cold pack applied to the outer ear for 15-20 minutes, 03-04 times per day or  as needed may reduce pain. Do not apply ice directly to the skin. You may cause frost bite.  Over-the-counter ear drops used as directed may be effective. Your caregiver may sometimes prescribe ear drops.  Resting in an upright position may help reduce pressure in the middle ear and relieve pain.  Ear pain caused by rapidly descending from high altitudes can be relieved by swallowing or chewing gum. Allowing infants to suck on a bottle during airplane travel  can help.  Do not smoke in the house or near children. If you are unable to quit smoking, smoke outside.  Control allergies. SEEK IMMEDIATE MEDICAL CARE IF:   You or your child are becoming sicker.  Pain or fever relief is not obtained with medicine.  You or your child's symptoms (pain, fever, or irritability) do not improve within 24 to 48 hours or as instructed.  Severe pain suddenly stops hurting. This may indicate a ruptured eardrum.  You or your children develop new problems such as severe headaches, stiff neck, difficulty swallowing, or swelling of the face or around the ear. Document Released: 01/17/2004 Document Revised: 08/24/2011 Document Reviewed: 05/23/2008 [facility] Patient Information 2015 [place], [place]. This information is not intended to replace advice given to you by your health care provider. Make sure you discuss any questions you have with your health care provider.  Otitis Externa Otitis externa is a bacterial or fungal infection of the outer ear canal. This is the area from the eardrum to the outside of the ear. Otitis externa is sometimes called "swimmer's ear." CAUSES  Possible causes of infection include:  Swimming in dirty water.  Moisture remaining in the ear after swimming or bathing.  Mild injury (trauma) to the ear.  Objects stuck in the ear (foreign body).  Cuts or scrapes (abrasions) on the outside of the ear. SIGNS AND SYMPTOMS  The first symptom of infection is often itching in the ear canal. Later signs and symptoms may include swelling and redness of the ear canal, ear pain, and yellowish-white fluid (pus) coming from the ear. The ear pain may be worse when pulling on the earlobe. DIAGNOSIS  Your health care provider will perform a physical exam. A sample of fluid may be taken from the ear and examined for bacteria or fungi. TREATMENT  Antibiotic ear drops are often given for 10 to 14 days. Treatment may also include pain medicine or  corticosteroids to reduce itching and swelling. HOME CARE INSTRUCTIONS   Apply antibiotic ear drops to the ear canal as prescribed by your health care provider.  Take medicines only as directed by your health care provider.  If you have diabetes, follow any additional treatment instructions from your health care provider.  Keep all follow-up visits as directed by your health care provider. PREVENTION   Keep your ear dry. Use the corner of a towel to absorb water out of the ear canal after swimming or bathing.  Avoid scratching or putting objects inside your ear. This can damage the ear canal or remove the protective wax that lines the canal. This makes it easier for bacteria and fungi to grow.  Avoid swimming in lakes, polluted water, or poorly chlorinated pools.  You may use ear drops made of rubbing alcohol and vinegar after swimming. Combine equal parts of white vinegar and alcohol in a bottle. Put 3 or 4 drops into each ear after swimming. SEEK MEDICAL CARE IF:   You have a fever.  Your ear is still red, swollen, painful,  or draining pus after 3 days.  Your redness, swelling, or pain gets worse.  You have a severe headache.  You have redness, swelling, pain, or tenderness in the area behind your ear. MAKE SURE YOU:   Understand these instructions.  Will watch your condition.  Will get help right away if you are not doing well or get worse. Document Released: 06/01/2005 Document Revised: 10/16/2013 Document Reviewed: 06/18/2011 [facility] Patient Information 2015 [place], [place]. This information is not intended to replace advice given to you by your health care provider. Make sure you discuss any questions you have with your health care provider.  Serous Otitis Media Serous otitis media is fluid in the middle ear space. This space contains the bones for hearing and air. Air in the middle ear space helps to transmit sound.  The air gets there through the eustachian tube.  This tube goes from the back of the nose (nasopharynx) to the middle ear space. It keeps the pressure in the middle ear the same as the outside world. It also helps to drain fluid from the middle ear space. CAUSES  Serous otitis media occurs when the eustachian tube gets blocked. Blockage can come from:  Ear infections.  Colds and other upper respiratory infections.  Allergies.  Irritants such as cigarette smoke.  Sudden changes in air pressure (such as descending in an airplane).  Enlarged adenoids.  A mass in the nasopharynx. During colds and upper respiratory infections, the middle ear space can become temporarily filled with fluid. This can happen after an ear infection also. Once the infection clears, the fluid will generally drain out of the ear through the eustachian tube. If it does not, then serous otitis media occurs. SIGNS AND SYMPTOMS   Hearing loss.  A feeling of fullness in the ear, without pain.  Young children may not show any symptoms but may show slight behavioral changes, such as agitation, ear pulling, or crying. DIAGNOSIS  Serous otitis media is diagnosed by an ear exam. Tests may be done to check on the movement of the eardrum. Hearing exams may also be done. TREATMENT  The fluid most often goes away without treatment. If allergy is the cause, allergy treatment may be helpful. Fluid that persists for several months may require minor surgery. A small tube is placed in the eardrum to:  Drain the fluid.  Restore the air in the middle ear space. In certain situations, antibiotic medicines are used to avoid surgery. Surgery may be done to remove enlarged adenoids (if this is the cause). HOME CARE INSTRUCTIONS   Keep children away from tobacco smoke.  Keep all follow-up visits as directed by your health care provider. SEEK MEDICAL CARE IF:   Your hearing is not better in 3 months.  Your hearing is worse.  You have ear pain.  You have drainage from the  ear.  You have dizziness.  You have serous otitis media only in one ear or have any bleeding from your nose (epistaxis).  You notice a lump on your neck. MAKE SURE YOU:  Understand these instructions.   Will watch your condition.   Will get help right away if you are not doing well or get worse.  Document Released: 08/22/2003 Document Revised: 10/16/2013 Document Reviewed: 12/27/2012 [facility] Patient Information 2015 [place], [place]. This information is not intended to replace advice given to you by your health care provider. Make sure you discuss any questions you have with your health care provider.

## 2015-12-24 ENCOUNTER — Ambulatory Visit: Admitting: Podiatry

## 2015-12-25 ENCOUNTER — Ambulatory Visit: Admitting: Podiatry

## 2016-01-06 ENCOUNTER — Ambulatory Visit (INDEPENDENT_AMBULATORY_CARE_PROVIDER_SITE_OTHER): Admitting: Physician Assistant

## 2016-01-06 VITALS — BP 118/78 | HR 67 | Temp 98.1°F | Resp 16 | Ht 73.5 in | Wt 235.0 lb

## 2016-01-06 DIAGNOSIS — F329 Major depressive disorder, single episode, unspecified: Secondary | ICD-10-CM | POA: Insufficient documentation

## 2016-01-06 DIAGNOSIS — F32A Depression, unspecified: Secondary | ICD-10-CM | POA: Insufficient documentation

## 2016-01-06 DIAGNOSIS — F419 Anxiety disorder, unspecified: Secondary | ICD-10-CM | POA: Insufficient documentation

## 2016-01-06 DIAGNOSIS — R1032 Left lower quadrant pain: Secondary | ICD-10-CM

## 2016-01-06 DIAGNOSIS — G47419 Narcolepsy without cataplexy: Secondary | ICD-10-CM | POA: Insufficient documentation

## 2016-01-06 DIAGNOSIS — I10 Essential (primary) hypertension: Secondary | ICD-10-CM | POA: Insufficient documentation

## 2016-01-06 LAB — POCT CBC
Granulocyte percent: 54.7 %G (ref 37–80)
HEMATOCRIT: 43.5 % (ref 43.5–53.7)
HEMOGLOBIN: 15.4 g/dL (ref 14.1–18.1)
LYMPH, POC: 2.6 (ref 0.6–3.4)
MCH, POC: 30.6 pg (ref 27–31.2)
MCHC: 35.5 g/dL — AB (ref 31.8–35.4)
MCV: 83.2 fL (ref 80–97)
MID (cbc): 0.4 (ref 0–0.9)
MPV: 7.5 fL (ref 0–99.8)
POC Granulocyte: 3.7 (ref 2–6.9)
POC LYMPH %: 38.9 % (ref 10–50)
POC MID %: 6.7 % (ref 0–12)
Platelet Count, POC: 167 10*3/uL (ref 142–424)
RBC: 5.04 M/uL (ref 4.69–6.13)
RDW, POC: 13.4 %
WBC: 6.7 10*3/uL (ref 4.6–10.2)

## 2016-01-06 NOTE — Patient Instructions (Addendum)
Please arrive at [facility] Admitting at 7:45am on 7/25    IF you received an x-ray today, you will receive an invoice from [facility] Radiology. Please contact [facility] Radiology at [phone] with questions or concerns regarding your invoice.   IF you received labwork today, you will receive an invoice from [employer]. Please contact [facility] at [phone] with questions or concerns regarding your invoice.   Our billing staff will not be able to assist you with questions regarding bills from these companies.  You will be contacted with the lab results as soon as they are available. The fastest way to get your results is to activate your My Chart account. Instructions are located on the last page of this paperwork. If you have not heard from [place] regarding the results in 2 weeks, please contact this office.

## 2016-01-06 NOTE — Progress Notes (Signed)
Ronald Carpenter  MRN: [id number] DOB: July 11, 1980  Subjective:  Pt presents to clinic with left lower abdominal pain that started this am when he was walking the dogs and they pulled on the leash.  The pain has been intermittently worse today based on what he is doing but the pain has not stopped.  Movements that make it worse are lifting.  When he picks up the heavy objects he has pain that radiates into his left groin area.  No urinary symptoms.  He moved over the weekend and did not feel any pain during that activity.  He had a normal BM after the pain started.  Review of Systems  Constitutional: Negative for chills and fever.  Gastrointestinal: Positive for abdominal pain.    Patient Active Problem List   Diagnosis Date Noted  . Hypertension 01/06/2016  . Narcolepsy 01/06/2016  . Anxiety 01/06/2016  . Depression 01/06/2016    Current Outpatient Prescriptions on File Prior to Visit  Medication Sig Dispense Refill  . methylphenidate (RITALIN LA) 30 MG 24 hr capsule Take 30 mg by mouth 2 (two) times daily.     . methylphenidate (RITALIN) 5 MG tablet Take 5 mg by mouth 3 (three) times daily as needed (For narcolepsy.).     Marland Kitchen propranolol (INDERAL) 10 MG tablet Take 10 mg by mouth 3 (three) times daily.    . Sodium Oxybate (XYREM) 500 MG/ML SOLN Take 3,750 mg by mouth at bedtime.     Marland Kitchen telmisartan-hydrochlorothiazide (MICARDIS HCT) 40-12.5 MG per tablet Take 1 tablet by mouth every morning.    . venlafaxine XR (EFFEXOR-XR) 75 MG 24 hr capsule Take 75 mg by mouth daily with breakfast.     No current facility-administered medications on file prior to visit.     No Known Allergies  Objective:  BP 118/78   Pulse 67   Temp 98.1 F (36.7 C)   Resp 16   Ht 6' 1.5" (1.867 m)   Wt 235 lb (106.6 kg)   SpO2 98%   BMI 30.58 kg/m   Physical Exam  Constitutional: He is oriented to person, place, and time and well-developed, well-nourished, and in no distress.  HENT:  Head:  Normocephalic and atraumatic.  Right Ear: External ear normal.  Left Ear: External ear normal.  Eyes: Conjunctivae are normal.  Neck: Normal range of motion.  Cardiovascular: Normal rate, regular rhythm and normal heart sounds.   No murmur heard. Pulmonary/Chest: Effort normal and breath sounds normal. He has no wheezes.  Abdominal: There is tenderness. There is guarding.    Neurological: He is alert and oriented to person, place, and time. Gait normal.  Skin: Skin is warm and dry.  Psychiatric: Mood, memory, affect and judgment normal.   Results for orders placed or performed in visit on 01/06/16  POCT CBC  Result Value Ref Range   WBC 6.7 4.6 - 10.2 K/uL   Lymph, poc 2.6 0.6 - 3.4   POC LYMPH PERCENT 38.9 10 - 50 %L   MID (cbc) 0.4 0 - 0.9   POC MID % 6.7 0 - 12 %M   POC Granulocyte 3.7 2 - 6.9   Granulocyte percent 54.7 37 - 80 %G   RBC 5.04 4.69 - 6.13 M/uL   Hemoglobin 15.4 14.1 - 18.1 g/dL   HCT, POC 40.9 81.1 - 53.7 %   MCV 83.2 80 - 97 fL   MCH, POC 30.6 27 - 31.2 pg   MCHC 35.5 (A) 31.8 - 35.4  g/dL   RDW, POC 70.9 %   Platelet Count, POC 167 142 - 424 K/uL   MPV 7.5 0 - 99.8 fL    Assessment and Plan :  LLQ pain - Plan: POCT CBC normal CBC - no obvious hernia palpable- it is possible this is a abd muscle wall tear so he will go tomorrow for an [place] to determine muscle tear vs hernia - if tear he will rest and maybe an ACE wrap for comfort and if hernia we will refer to [facility].  Pt understands and agrees with the plan.  Seen with Dr Nehemiah Settle PA-C  [facility] 01/06/2016 12:58 PM

## 2016-01-07 ENCOUNTER — Other Ambulatory Visit: Payer: Self-pay | Admitting: Urgent Care

## 2016-01-07 ENCOUNTER — Ambulatory Visit (HOSPITAL_COMMUNITY)
Admission: RE | Admit: 2016-01-07 | Discharge: 2016-01-07 | Disposition: A | Discharge status: Home / Self Care | Source: Ambulatory Visit | Attending: Physician Assistant | Admitting: Physician Assistant

## 2016-01-07 ENCOUNTER — Ambulatory Visit (HOSPITAL_COMMUNITY)
Admission: RE | Admit: 2016-01-07 | Discharge: 2016-01-07 | Disposition: A | Discharge status: Home / Self Care | Source: Ambulatory Visit | Attending: Urgent Care | Admitting: Urgent Care

## 2016-01-07 ENCOUNTER — Telehealth: Payer: Self-pay | Admitting: *Deleted

## 2016-01-07 DIAGNOSIS — R1084 Generalized abdominal pain: Secondary | ICD-10-CM

## 2016-01-07 DIAGNOSIS — R1032 Left lower quadrant pain: Secondary | ICD-10-CM

## 2016-01-07 MED ORDER — IOPAMIDOL (ISOVUE-300) INJECTION 61%
INTRAVENOUS | Status: AC
  Filled 2016-01-07: qty 100

## 2016-01-07 NOTE — Telephone Encounter (Signed)
I tried to call patient to both numbers provided and I did not get an answer. Please let patient know that his belly CT was normal and should come back for recheck with PA-Sarah tomorrow 8-1.

## 2016-01-08 ENCOUNTER — Telehealth: Payer: Self-pay

## 2016-01-08 ENCOUNTER — Ambulatory Visit (INDEPENDENT_AMBULATORY_CARE_PROVIDER_SITE_OTHER): Admitting: Physician Assistant

## 2016-01-08 VITALS — BP 122/74 | HR 78 | Temp 98.7°F | Resp 18 | Ht 73.5 in | Wt 238.0 lb

## 2016-01-08 DIAGNOSIS — R1032 Left lower quadrant pain: Secondary | ICD-10-CM | POA: Diagnosis not present

## 2016-01-08 MED ORDER — TRAMADOL HCL 50 MG PO TABS: 50.0000 mg | ORAL_TABLET | Freq: Three times a day (TID) | ORAL | 0 refills | Status: AC | PRN

## 2016-01-08 NOTE — Telephone Encounter (Signed)
.  Pt is here now.

## 2016-01-08 NOTE — Telephone Encounter (Signed)
Pt just saw Ronald Carpenter and has more questions about his visit from today   Best number [phone]

## 2016-01-08 NOTE — Patient Instructions (Addendum)
     IF you received an x-ray today, you will receive an invoice from [facility] Radiology. Please contact [facility] Radiology at [phone] with questions or concerns regarding your invoice.   IF you received labwork today, you will receive an invoice from [employer]. Please contact [facility] at [phone] with questions or concerns regarding your invoice.   Our billing staff will not be able to assist you with questions regarding bills from these companies.  You will be contacted with the lab results as soon as they are available. The fastest way to get your results is to activate your My Chart account. Instructions are located on the last page of this paperwork. If you have not heard from [place] regarding the results in 2 weeks, please contact this office.    You are scheduled August 3,2017 @ 1:30pm with Dr. Franky Macho Kinsinger Need to arrive at 1pm and enter through the [facility]  The address is [address] located @ the [employer]

## 2016-01-08 NOTE — Progress Notes (Signed)
Ronald Carpenter  MRN: [id number] DOB: 10/18/1980  Subjective:  Pt presents to clinic for worsening pain in his LLQ since his visit 2 days ago.  The pain seems worse and is now radiating more to his left scrotum area.  He still feels fine and is having no urinary symptoms and no change in his bowel habits.  He is eating without problems.  Review of Systems  Constitutional: Negative for appetite change.  Gastrointestinal: Positive for abdominal pain. Negative for constipation and diarrhea.  Genitourinary: Negative for scrotal swelling.    Patient Active Problem List   Diagnosis Date Noted  . Hypertension 01/06/2016  . Narcolepsy 01/06/2016  . Anxiety 01/06/2016  . Depression 01/06/2016    Current Outpatient Prescriptions on File Prior to Visit  Medication Sig Dispense Refill  . methylphenidate (RITALIN LA) 30 MG 24 hr capsule Take 30 mg by mouth 2 (two) times daily.     . methylphenidate (RITALIN) 5 MG tablet Take 5 mg by mouth 3 (three) times daily as needed (For narcolepsy.).     Marland Kitchen propranolol (INDERAL) 10 MG tablet Take 10 mg by mouth 3 (three) times daily.    . Sodium Oxybate (XYREM) 500 MG/ML SOLN Take 3,750 mg by mouth at bedtime.     Marland Kitchen telmisartan-hydrochlorothiazide (MICARDIS HCT) 40-12.5 MG per tablet Take 1 tablet by mouth every morning.    . venlafaxine XR (EFFEXOR-XR) 75 MG 24 hr capsule Take 75 mg by mouth daily with breakfast.     No current facility-administered medications on file prior to visit.     No Known Allergies  Objective:  BP 122/74   Pulse 78   Temp 98.7 F (37.1 C) (Oral)   Resp 18   Ht 6' 1.5" (1.867 m)   Wt 238 lb (108 kg)   SpO2 97%   BMI 30.97 kg/m   Physical Exam  Constitutional: He is oriented to person, place, and time and well-developed, well-nourished, and in no distress.  HENT:  Head: Normocephalic and atraumatic.  Right Ear: External ear normal.  Left Ear: External ear normal.  Eyes: Conjunctivae are normal.  Neck: Normal  range of motion.  Cardiovascular: Normal rate.   Pulmonary/Chest: Effort normal.  Abdominal: A hernia is present. Hernia confirmed positive in the left inguinal area (? palpable bulge - pt is very tender at the internal inguinal ring). Hernia confirmed negative in the right inguinal area.    Neurological: He is alert and oriented to person, place, and time. Gait normal.  Skin: Skin is warm and dry.  Psychiatric: Mood, memory, affect and judgment normal.   Ct Abdomen Pelvis W Contrast  Result Date: 01/07/2016 CLINICAL DATA:  Left lower quadrant pain. Question incarcerated hernia. EXAM: CT ABDOMEN AND PELVIS WITH CONTRAST TECHNIQUE: Multidetector CT imaging of the abdomen and pelvis was performed using the standard protocol following bolus administration of intravenous contrast. CONTRAST:  100 ISOVUE-300 IOPAMIDOL (ISOVUE-300) INJECTION 61% COMPARISON:  Ultrasound performed earlier today.  CT 02/21/2004 FINDINGS: Lower chest: Lung bases are clear. No effusions. Heart is normal size. Hepatobiliary: Prior cholecystectomy.  No focal hepatic abnormality. Pancreas: No focal abnormality or ductal dilatation. Spleen: No focal abnormality.  Normal size. Adrenals/Urinary Tract: No adrenal abnormality. No focal renal abnormality. No stones or hydronephrosis. Urinary bladder is unremarkable. Stomach/Bowel: Stomach, large and small bowel grossly unremarkable. Vascular/Lymphatic: No evidence of aneurysm or adenopathy. Reproductive: No visible focal abnormality. Other: No free fluid or free air. No visible ventral hernia or inguinal hernia. Musculoskeletal: No acute  bony abnormality or focal bone lesion. IMPRESSION: No acute findings in the abdomen or pelvis. Electronically Signed   By: Charlett Nose M.D.   On: 01/07/2016 11:48  US Abdomen Limited  Result Date: 01/07/2016 CLINICAL DATA:  Left lower quadrant abdominal pain. Pain following injury. Increased pain with lifting. EXAM: LIMITED ABDOMINAL ULTRASOUND  COMPARISON:  None. FINDINGS: The left rectus abdominus muscle is visualized and appears to be intact. Other abdominal wall musculature is intact is well. There is no significant asymmetry compared to the right. Inter abdominal contents are not well evaluated by ultrasound. IMPRESSION: 1. No evidence for hernia or abdominal wall muscle injury. 2. Inter abdominal contents are not well evaluated by ultrasound. CT of the abdomen and pelvis with contrast may be useful for further evaluation. Electronically Signed   By: Marin Roberts M.D.   On: 01/07/2016 08:55  Assessment and Plan :  LLQ pain - Plan: Ambulatory referral to General Surgery, traMADol (ULTRAM) 50 MG tablet  Pt is describing and the exam is consistent with a disruption of the wall near the inguinal ring - no definite bugle is palpate but the area is really tender.  There was no abd pathology on his CT so having a surgeon evaluate is the next step.  He will RTW and be careful with lifting - he was will pain medication until he sees the surgeon - if the pain worsens he should RTC prior to seeing the surgeon.  D/w Dr Edrick Kins PA-C  [facility] 01/08/2016 10:59 AM

## 2016-01-09 NOTE — Telephone Encounter (Signed)
Attempted to contact pt, unable to leave VM.

## 2016-01-17 ENCOUNTER — Ambulatory Visit: Payer: Self-pay | Admitting: General Surgery

## 2016-01-22 ENCOUNTER — Encounter: Payer: Self-pay | Admitting: Physician Assistant

## 2016-01-22 DIAGNOSIS — K4090 Unilateral inguinal hernia, without obstruction or gangrene, not specified as recurrent: Secondary | ICD-10-CM | POA: Insufficient documentation

## 2016-01-31 ENCOUNTER — Encounter (HOSPITAL_BASED_OUTPATIENT_CLINIC_OR_DEPARTMENT_OTHER): Payer: Self-pay | Admitting: *Deleted

## 2016-02-03 ENCOUNTER — Encounter (HOSPITAL_BASED_OUTPATIENT_CLINIC_OR_DEPARTMENT_OTHER): Payer: Self-pay | Admitting: *Deleted

## 2016-02-03 NOTE — Progress Notes (Signed)
NPO AFTER MN W/ EXCEPTION CLEAR LIQUIDS UNTIL 0900 (NO CREAM /MILK PRODUCTS) .  PT VERBALIZED UNDERSTANDING NO DIP TOBACCO AFTER 0900.   ARRIVE AT 1245.  NEEDS ISTAT AND EKG.  WILL TAKE NEXIUM, EFFEXOR, AND PROPANOLOL AM DOS W/ SIPS WATER.  WILL DO HIBICLENS SHOWER HS BEFORE AND AM DOS.

## 2016-02-10 ENCOUNTER — Ambulatory Visit (HOSPITAL_BASED_OUTPATIENT_CLINIC_OR_DEPARTMENT_OTHER): Admitting: Anesthesiology

## 2016-02-10 ENCOUNTER — Other Ambulatory Visit: Payer: Self-pay

## 2016-02-10 ENCOUNTER — Encounter (HOSPITAL_BASED_OUTPATIENT_CLINIC_OR_DEPARTMENT_OTHER)
Admission: RE | Disposition: A | Discharge status: Home / Self Care | Payer: Self-pay | Source: Ambulatory Visit | Attending: General Surgery

## 2016-02-10 ENCOUNTER — Encounter (HOSPITAL_BASED_OUTPATIENT_CLINIC_OR_DEPARTMENT_OTHER): Payer: Self-pay | Admitting: *Deleted

## 2016-02-10 ENCOUNTER — Ambulatory Visit (HOSPITAL_BASED_OUTPATIENT_CLINIC_OR_DEPARTMENT_OTHER)
Admission: RE | Admit: 2016-02-10 | Discharge: 2016-02-10 | Disposition: A | Discharge status: Home / Self Care | Source: Ambulatory Visit | Attending: General Surgery | Admitting: General Surgery

## 2016-02-10 DIAGNOSIS — Z6830 Body mass index (BMI) 30.0-30.9, adult: Secondary | ICD-10-CM | POA: Insufficient documentation

## 2016-02-10 DIAGNOSIS — Z79899 Other long term (current) drug therapy: Secondary | ICD-10-CM | POA: Insufficient documentation

## 2016-02-10 DIAGNOSIS — I10 Essential (primary) hypertension: Secondary | ICD-10-CM | POA: Diagnosis not present

## 2016-02-10 DIAGNOSIS — G4730 Sleep apnea, unspecified: Secondary | ICD-10-CM | POA: Diagnosis not present

## 2016-02-10 DIAGNOSIS — E669 Obesity, unspecified: Secondary | ICD-10-CM | POA: Insufficient documentation

## 2016-02-10 DIAGNOSIS — M1990 Unspecified osteoarthritis, unspecified site: Secondary | ICD-10-CM | POA: Insufficient documentation

## 2016-02-10 DIAGNOSIS — Z87891 Personal history of nicotine dependence: Secondary | ICD-10-CM | POA: Diagnosis not present

## 2016-02-10 DIAGNOSIS — K4090 Unilateral inguinal hernia, without obstruction or gangrene, not specified as recurrent: Secondary | ICD-10-CM | POA: Diagnosis not present

## 2016-02-10 HISTORY — DX: Unilateral inguinal hernia, without obstruction or gangrene, not specified as recurrent: K40.90

## 2016-02-10 HISTORY — DX: Other complications of anesthesia, initial encounter: T88.59XA

## 2016-02-10 HISTORY — DX: Unspecified osteoarthritis, unspecified site: M19.90

## 2016-02-10 HISTORY — DX: Personal history of traumatic brain injury: Z87.820

## 2016-02-10 HISTORY — DX: Migraine, unspecified, not intractable, without status migrainosus: G43.909

## 2016-02-10 HISTORY — DX: Other general symptoms and signs: R68.89

## 2016-02-10 HISTORY — PX: INGUINAL HERNIA REPAIR: SHX194

## 2016-02-10 HISTORY — PX: INSERTION OF MESH: SHX5868

## 2016-02-10 HISTORY — DX: Adverse effect of unspecified anesthetic, initial encounter: T41.45XA

## 2016-02-10 LAB — POCT I-STAT 4, (NA,K, GLUC, HGB,HCT)
Glucose, Bld: 88 mg/dL (ref 65–99)
HEMATOCRIT: 46 % (ref 39.0–52.0)
HEMOGLOBIN: 15.6 g/dL (ref 13.0–17.0)
Potassium: 4 mmol/L (ref 3.5–5.1)
Sodium: 142 mmol/L (ref 135–145)

## 2016-02-10 SURGERY — REPAIR, HERNIA, INGUINAL, LAPAROSCOPIC
Anesthesia: General | Site: Inguinal | Laterality: Left

## 2016-02-10 MED ORDER — MIDAZOLAM HCL 5 MG/5ML IJ SOLN
INTRAMUSCULAR | Status: DC | PRN
  Administered 2016-02-10: 2 mg via INTRAVENOUS

## 2016-02-10 MED ORDER — DEXAMETHASONE SODIUM PHOSPHATE 4 MG/ML IJ SOLN
INTRAMUSCULAR | Status: DC | PRN
  Administered 2016-02-10: 10 mg via INTRAVENOUS

## 2016-02-10 MED ORDER — SUCCINYLCHOLINE CHLORIDE 20 MG/ML IJ SOLN
INTRAMUSCULAR | Status: DC | PRN
  Administered 2016-02-10: 100 mg via INTRAVENOUS

## 2016-02-10 MED ORDER — GLYCOPYRROLATE 0.2 MG/ML IJ SOLN
INTRAMUSCULAR | Status: AC
  Filled 2016-02-10: qty 1

## 2016-02-10 MED ORDER — PROMETHAZINE HCL 25 MG/ML IJ SOLN
6.2500 mg | INTRAMUSCULAR | Status: DC | PRN
  Filled 2016-02-10: qty 1

## 2016-02-10 MED ORDER — SUGAMMADEX SODIUM 200 MG/2ML IV SOLN
INTRAVENOUS | Status: AC
  Filled 2016-02-10: qty 2

## 2016-02-10 MED ORDER — CEFAZOLIN SODIUM-DEXTROSE 2-4 GM/100ML-% IV SOLN
2.0000 g | INTRAVENOUS | Status: AC
  Administered 2016-02-10: 2 g via INTRAVENOUS
  Filled 2016-02-10: qty 100

## 2016-02-10 MED ORDER — HYDROCODONE-ACETAMINOPHEN 5-325 MG PO TABS
1.0000 | ORAL_TABLET | Freq: Four times a day (QID) | ORAL | 0 refills | Status: DC | PRN
Start: 2016-02-10 — End: 2016-10-28

## 2016-02-10 MED ORDER — ONDANSETRON HCL 4 MG/2ML IJ SOLN
INTRAMUSCULAR | Status: DC | PRN
  Administered 2016-02-10: 4 mg via INTRAVENOUS

## 2016-02-10 MED ORDER — OXYCODONE HCL 5 MG PO TABS
ORAL_TABLET | ORAL | Status: AC
  Filled 2016-02-10: qty 1

## 2016-02-10 MED ORDER — CHLORHEXIDINE GLUCONATE CLOTH 2 % EX PADS
6.0000 | MEDICATED_PAD | Freq: Once | CUTANEOUS | Status: DC
  Filled 2016-02-10: qty 6

## 2016-02-10 MED ORDER — GLYCOPYRROLATE 0.2 MG/ML IJ SOLN
INTRAMUSCULAR | Status: DC | PRN
  Administered 2016-02-10: 0.2 mg via INTRAVENOUS

## 2016-02-10 MED ORDER — FENTANYL CITRATE (PF) 250 MCG/5ML IJ SOLN
INTRAMUSCULAR | Status: AC
  Filled 2016-02-10: qty 5

## 2016-02-10 MED ORDER — LIDOCAINE HCL (CARDIAC) 20 MG/ML IV SOLN
INTRAVENOUS | Status: AC
  Filled 2016-02-10: qty 5

## 2016-02-10 MED ORDER — CELECOXIB 400 MG PO CAPS
400.0000 mg | ORAL_CAPSULE | ORAL | Status: AC
  Administered 2016-02-10: 400 mg via ORAL
  Filled 2016-02-10 (×2): qty 1

## 2016-02-10 MED ORDER — GABAPENTIN 300 MG PO CAPS
300.0000 mg | ORAL_CAPSULE | ORAL | Status: AC
  Administered 2016-02-10: 300 mg via ORAL
  Filled 2016-02-10 (×2): qty 1

## 2016-02-10 MED ORDER — FENTANYL CITRATE (PF) 100 MCG/2ML IJ SOLN
INTRAMUSCULAR | Status: DC | PRN
  Administered 2016-02-10 (×3): 50 ug via INTRAVENOUS

## 2016-02-10 MED ORDER — OXYCODONE HCL 5 MG PO TABS
5.0000 mg | ORAL_TABLET | Freq: Once | ORAL | Status: AC
  Administered 2016-02-10: 5 mg via ORAL
  Filled 2016-02-10: qty 1

## 2016-02-10 MED ORDER — ROCURONIUM BROMIDE 100 MG/10ML IV SOLN
INTRAVENOUS | Status: DC | PRN
  Administered 2016-02-10: 30 mg via INTRAVENOUS

## 2016-02-10 MED ORDER — LACTATED RINGERS IV SOLN
INTRAVENOUS | Status: DC
  Administered 2016-02-10 (×2): via INTRAVENOUS
  Filled 2016-02-10: qty 1000

## 2016-02-10 MED ORDER — FENTANYL CITRATE (PF) 100 MCG/2ML IJ SOLN
25.0000 ug | INTRAMUSCULAR | Status: DC | PRN
  Filled 2016-02-10: qty 1

## 2016-02-10 MED ORDER — BUPIVACAINE LIPOSOME 1.3 % IJ SUSP
INTRAMUSCULAR | Status: DC | PRN
  Administered 2016-02-10: 40 mL

## 2016-02-10 MED ORDER — ONDANSETRON HCL 4 MG/2ML IJ SOLN
INTRAMUSCULAR | Status: AC
  Filled 2016-02-10: qty 2

## 2016-02-10 MED ORDER — LIDOCAINE HCL (CARDIAC) 20 MG/ML IV SOLN
INTRAVENOUS | Status: DC | PRN
  Administered 2016-02-10: 60 mg via INTRAVENOUS

## 2016-02-10 MED ORDER — ROCURONIUM BROMIDE 100 MG/10ML IV SOLN
INTRAVENOUS | Status: AC
  Filled 2016-02-10: qty 1

## 2016-02-10 MED ORDER — PROPOFOL 10 MG/ML IV BOLUS
INTRAVENOUS | Status: AC
  Filled 2016-02-10: qty 40

## 2016-02-10 MED ORDER — CEFAZOLIN SODIUM-DEXTROSE 2-4 GM/100ML-% IV SOLN
INTRAVENOUS | Status: AC
  Filled 2016-02-10: qty 100

## 2016-02-10 MED ORDER — MIDAZOLAM HCL 2 MG/2ML IJ SOLN
INTRAMUSCULAR | Status: AC
  Filled 2016-02-10: qty 2

## 2016-02-10 MED ORDER — ACETAMINOPHEN 500 MG PO TABS
ORAL_TABLET | ORAL | Status: AC
  Filled 2016-02-10: qty 2

## 2016-02-10 MED ORDER — DEXAMETHASONE SODIUM PHOSPHATE 10 MG/ML IJ SOLN
INTRAMUSCULAR | Status: AC
  Filled 2016-02-10: qty 1

## 2016-02-10 MED ORDER — ACETAMINOPHEN 500 MG PO TABS
1000.0000 mg | ORAL_TABLET | ORAL | Status: AC
  Administered 2016-02-10: 1000 mg via ORAL
  Filled 2016-02-10: qty 2

## 2016-02-10 MED ORDER — PROPOFOL 10 MG/ML IV BOLUS
INTRAVENOUS | Status: DC | PRN
  Administered 2016-02-10: 200 mg via INTRAVENOUS

## 2016-02-10 MED ORDER — IBUPROFEN 800 MG PO TABS: 800.0000 mg | ORAL_TABLET | Freq: Three times a day (TID) | ORAL | 0 refills | Status: AC | PRN

## 2016-02-10 SURGICAL SUPPLY — 47 items
BLADE 11 SAFETY STRL DISP (BLADE) ×3 IMPLANT
BLADE CLIPPER SENSICLIP SURGIC (BLADE) ×3 IMPLANT
BNDG GAUZE ELAST 4 BULKY (GAUZE/BANDAGES/DRESSINGS) IMPLANT
CABLE HIGH FREQUENCY MONO STRZ (ELECTRODE) ×2 IMPLANT
CHLORAPREP W/TINT 26ML (MISCELLANEOUS) ×3 IMPLANT
COVER BACK TABLE 60X90IN (DRAPES) ×3 IMPLANT
COVER MAYO STAND STRL (DRAPES) ×3 IMPLANT
DECANTER SPIKE VIAL GLASS SM (MISCELLANEOUS) ×3 IMPLANT
DEVICE SECURE STRAP 25 ABSORB (INSTRUMENTS) ×2 IMPLANT
DISSECT BALLN SPACEMKR + OVL (BALLOONS) ×3
DISSECTOR BALLN SPACEMKR + OVL (BALLOONS) ×1 IMPLANT
DRAPE LAPAROSCOPIC ABDOMINAL (DRAPES) ×3 IMPLANT
DRAPE UTILITY XL STRL (DRAPES) ×3 IMPLANT
ELECT REM PT RETURN 9FT ADLT (ELECTROSURGICAL) ×3
ELECTRODE REM PT RTRN 9FT ADLT (ELECTROSURGICAL) ×1 IMPLANT
GLOVE BIO SURGEON STRL SZ 6.5 (GLOVE) ×1 IMPLANT
GLOVE BIO SURGEON STRL SZ7 (GLOVE) ×2 IMPLANT
GLOVE BIO SURGEON STRL SZ7.5 (GLOVE) ×2 IMPLANT
GLOVE BIO SURGEONS STRL SZ 6.5 (GLOVE) ×1
GLOVE BIOGEL PI IND STRL 6.5 (GLOVE) IMPLANT
GLOVE BIOGEL PI IND STRL 7.0 (GLOVE) ×1 IMPLANT
GLOVE BIOGEL PI IND STRL 7.5 (GLOVE) IMPLANT
GLOVE BIOGEL PI INDICATOR 6.5 (GLOVE) ×2
GLOVE BIOGEL PI INDICATOR 7.0 (GLOVE) ×4
GLOVE BIOGEL PI INDICATOR 7.5 (GLOVE) ×6
GLOVE SURG SS PI 7.0 STRL IVOR (GLOVE) ×3 IMPLANT
GOWN STRL REUS W/TWL LRG LVL3 (GOWN DISPOSABLE) ×11 IMPLANT
LIQUID BAND (GAUZE/BANDAGES/DRESSINGS) ×3 IMPLANT
MESH 3DMAX LIGHT 4.1X6.2 LT LR (Mesh General) ×2 IMPLANT
NDL HYPO 25X1 1.5 SAFETY (NEEDLE) ×1 IMPLANT
NEEDLE HYPO 25X1 1.5 SAFETY (NEEDLE) ×3 IMPLANT
PACK BASIN DAY SURGERY FS (CUSTOM PROCEDURE TRAY) ×3 IMPLANT
PADDING ION DISPOSABLE (MISCELLANEOUS) ×3 IMPLANT
SCISSORS LAP 5X35 DISP (ENDOMECHANICALS) IMPLANT
SET IRRIG TUBING LAPAROSCOPIC (IRRIGATION / IRRIGATOR) ×3 IMPLANT
SHEARS HARMONIC ACE PLUS 36CM (ENDOMECHANICALS) IMPLANT
SOLUTION ANTI FOG 6CC (MISCELLANEOUS) ×3 IMPLANT
SUT MNCRL AB 4-0 PS2 18 (SUTURE) ×3 IMPLANT
SUT VIC AB 2-0 SH 27 (SUTURE)
SUT VIC AB 2-0 SH 27X BRD (SUTURE) IMPLANT
SUT VICRYL 0 UR6 27IN ABS (SUTURE) ×3 IMPLANT
SUT VLOC BARB 180 ABS3/0GR12 (SUTURE)
SUTURE VLOC BRB 180 ABS3/0GR12 (SUTURE) IMPLANT
SYR CONTROL 10ML LL (SYRINGE) ×3 IMPLANT
TOWEL OR 17X24 6PK STRL BLUE (TOWEL DISPOSABLE) ×6 IMPLANT
TROCAR CANNULA W/PORT DUAL 5MM (MISCELLANEOUS) ×3 IMPLANT
TUBING INSUF HEATED (TUBING) ×3 IMPLANT

## 2016-02-10 NOTE — Transfer of Care (Signed)
Last Vitals:  Vitals:   02/10/16 1259  BP: 140/83  Pulse: 73  Resp: 16  Temp: 36.4 C    Last Pain:  Vitals:   02/10/16 1312  TempSrc:   PainSc: 3       Patients Stated Pain Goal: 9 (02/10/16 1312)  Immediate Anesthesia Transfer of Care Note  Patient: Ronald Carpenter  Procedure(s) Performed: Procedure(s) (LRB): LAPAROSCOPIC LEFT INGUINAL HERNIA REPAIR WITH MESH (Left) INSERTION OF MESH (Left)  Patient Location: PACU  Anesthesia Type: General  Level of Consciousness: awake, alert  and oriented  Airway & Oxygen Therapy: Patient Spontanous Breathing and Patient connected to nasal cannula oxygen  Post-op Assessment: Report given to PACU RN and Post -op Vital signs reviewed and stable  Post vital signs: Reviewed and stable  Complications: No apparent anesthesia complications

## 2016-02-10 NOTE — Anesthesia Procedure Notes (Signed)
Procedure Name: Intubation Date/Time: 02/10/2016 2:58 PM Performed by: EDWARDS, CHARLENE Pre-anesthesia Checklist: Patient identified, Emergency Drugs available, Suction available and Patient being monitored Patient Re-evaluated:Patient Re-evaluated prior to inductionOxygen Delivery Method: Circle system utilized Preoxygenation: Pre-oxygenation with 100% oxygen Intubation Type: IV induction Ventilation: Mask ventilation without difficulty Laryngoscope Size: Mac and 4 Grade View: Grade I Tube type: Oral Tube size: 8.0 mm Number of attempts: 1 Airway Equipment and Method: Stylet and LTA kit utilized Placement Confirmation: ETT inserted through vocal cords under direct vision,  positive ETCO2 and breath sounds checked- equal and bilateral Secured at: 23 cm Tube secured with: Tape Dental Injury: Teeth and Oropharynx as per pre-operative assessment        

## 2016-02-10 NOTE — Op Note (Signed)
Preop diagnosis: left inguinal hernia  Postop diagnosis: left indirect inguinal hernia  Procedure: laparoscopic Left inguinal hernia repair with mesh  Surgeon: Feliciana RossettiLuke Indra Wolters, M.D.  Asst: none  Anesthesia: Gen.   Indications for procedure: Ronald SheetsJonathan Carpenter is a 35 y.o. male with symptoms of pain and enlarging Left inguinal hernia(s). After discussing risks, alternatives and benefits he decided on laparoscopic repair and was brought to day surgery for repair.  Description of procedure: The patient was brought into the operative suite, placed supine. Anesthesia was administered with endotracheal tube. Patient was strapped in place. The patient was prepped and draped in the usual sterile fashion.  Next quarter percent Marcaine was injected to the Right of the umbilicus, and a transverse 2 cm incision was made. Dissection was used to visualize the anterior rectus sheath. Anterior rectus sheath was sharply incised, next to the 10 mm balloon dissecting trocar was inserted without resistance. laparoscope was inserted the balloon was inflated under direct visualization. Balloon portion of the trocar was removed laparoscope was inserted and pneumoperitoneum was applied. Exparel was used then used to anesthetize the pubic area, and one area 5 cm superior in the middle area. 1 cm incisions were made over each these areas and 5 mm trocar was inserted under direct visualization.  On initial visualization , the peritoneum was adherent to the lateral abdominal wall and hernia sac was slightly reduced from the deep inguinal ring. Next a began our dissection identifying the ASIS laterally and then working back medially removing the filmy tissue and adhesions of the peritoneum to the abdominal wall. Hernia sac was completely dissected out of the canal. Vas deference and contents of the cord were safely dissected away of the hernia sac.   A 3D max light weight mesh was inserted and tacked medially to the lacunar  ligament. One additional tack was placed in the lateral space. The mesh was positioned flat and directly up against the direct and indirect areas. One tear in the peritoneum was seen on the right side superior to the deep inguinal ring. The CO2 was evacuated while watching to ensure the mesh did not migrate. The remainder of the Exparel mix was infused 2cm medial and 2cm inferior to the ASIS deep to the fascia. The anterior rectus fascia was closed with 0 vicryl in interrupted sutures and all skin incisions were closed with 4-0 monocryl subcu stitch. The patient awoke from anesthesia and was brought to PACU in stable condition.  Findings: left indirect inguinal hernia  Specimen: none  Blood loss: <30 ml  Local anesthesia: 40 ml 1:1 Exparel:Saline  Complications: none  Implant: left large 3dmax light mesh  Feliciana RossettiLuke Xavious Sharrar, M.D. General, Bariatric, & Minimally Invasive Surgery [facility], [place] 4:05 PM 02/10/2016

## 2016-02-10 NOTE — Discharge Instructions (Signed)
Information for Discharge Teaching: EXPAREL (bupivacaine liposome injectable suspension)   Your surgeon gave you EXPAREL(bupivacaine) in your surgical incision to help control your pain after surgery.   EXPAREL is a local anesthetic that provides pain relief by numbing the tissue around the surgical site.  EXPAREL is designed to release pain medication over time and can control pain for up to 72 hours.  Depending on how you respond to EXPAREL, you may require less pain medication during your recovery.  Possible side effects:  Temporary loss of sensation or ability to move in the area where bupivacaine was injected.  Nausea, vomiting, constipation  Rarely, numbness and tingling in your mouth or lips, lightheadedness, or anxiety may occur.  Call your doctor right away if you think you may be experiencing any of these sensations, or if you have other questions regarding possible side effects.  Follow all other discharge instructions given to you by your surgeon or nurse. Eat a healthy diet and drink plenty of water or other fluids.  If you return to the hospital for any reason within 96 hours following the administration of EXPAREL, please inform your health care providers.HOME CARE INSTRUCTIONS - LAPAROSCOPY  Wound Care: The bandaids or dressing which are placed over the skin openings may be removed the day after surgery. The incision should be kept clean and dry. The stitches do not need to be removed. Should the incision become sore, red, and swollen after the first week, check with your doctor.  Personal Hygiene: Shower the day after your procedure. Always wipe from front to back after elimination.   Activity: Do not drive or operate any equipment today. The effects of the anesthesia are still present and drowsiness may result. Rest today, not necessarily flat bed rest, just take it easy. You may resume your normal activity in one to three days or as instructed by your  physician.  Diet: Eat a light diet as desired this evening. You may resume a regular diet tomorrow.  Return to Work: Two to three days or as indicated by your doctor.  Expectations After Surgery: Your surgery will cause vaginal drainage or spotting which may continue for 2-3 days. Mild abdominal discomfort or tenderness is not unusual and some shoulder pain may also be noted which can be relieved by lying flat in pain.  Call Your Doctor If these Occur:  Persistent or heavy bleeding at incision site       Redness or swelling around incision       Elevation of temperature greater than 100 degrees F  Call for follow-up appointment _____________. Post Anesthesia Home Care Instructions  Activity: Get plenty of rest for the remainder of the day. A responsible adult should stay with you for 24 hours following the procedure.  For the next 24 hours, DO NOT: -Drive a car -Advertising copywriterperate machinery -Drink alcoholic beverages -Take any medication unless instructed by your physician -Make any legal decisions or sign important papers.  Meals: Start with liquid foods such as gelatin or soup. Progress to regular foods as tolerated. Avoid greasy, spicy, heavy foods. If nausea and/or vomiting occur, drink only clear liquids until the nausea and/or vomiting subsides. Call your physician if vomiting continues.  Special Instructions/Symptoms: Your throat may feel dry or sore from the anesthesia or the breathing tube placed in your throat during surgery. If this causes discomfort, gargle with warm salt water. The discomfort should disappear within 24 hours.  If you had a scopolamine patch placed behind your ear for  the management of post- operative nausea and/or vomiting:  1. The medication in the patch is effective for 72 hours, after which it should be removed.  Wrap patch in a tissue and discard in the trash. Wash hands thoroughly with soap and water. 2. You may remove the patch earlier than 72 hours if you  experience unpleasant side effects which may include dry mouth, dizziness or visual disturbances. 3. Avoid touching the patch. Wash your hands with soap and water after contact with the patch.

## 2016-02-10 NOTE — Interval H&P Note (Signed)
History and Physical Interval Note:  02/10/2016 2:33 PM  Ronald SheetsJonathan Ammar  has presented today for surgery, with the diagnosis of Left inguinal hernia without obstruction or gangrene  The various methods of treatment have been discussed with the patient and family. After consideration of risks, benefits and other options for treatment, the patient has consented to  Procedure(s): LAPAROSCOPIC LEFT INGUINAL HERNIA REPAIR WITH MESH (Left) INSERTION OF MESH (Left) as a surgical intervention .  The patient's history has been reviewed, patient examined, no change in status, stable for surgery.  I have reviewed the patient's chart and labs.  Questions were answered to the patient's satisfaction.     De BlanchLuke Aaron Terryon Pineiro

## 2016-02-10 NOTE — H&P (Signed)
Gastroesophageal Reflux Disease High blood pressure Migraine Headache Sleep Apnea  Past Surgical History Michel Bickers, LPN; 06/20/1094 0:45 PM) Gallbladder Surgery - Laparoscopic  Diagnostic Studies History Michel Bickers, LPN; 4/0/9811 9:14 PM) Colonoscopy 1-5 years ago  Allergies Michel Bickers, LPN; 12/21/2954 2:13 PM) No Known Allergies08/08/2015  Medication History Michel Bickers, LPN; 0/01/6577 4:69 PM) TraMADol HCl (50MG  Tablet, Oral) Active. Ritalin LA (30MG  Capsule ER 24HR, Oral) Active. Ritalin (5MG  Tablet, Oral) Active. Effexor (75MG  Tablet, Oral) Active. Inderal (10MG  Tablet, Oral) Active. Xyrem (500MG /ML Solution, Oral) Active. Micardis HCT (40-12.5MG  Tablet, Oral) Active. Medications Reconciled  Social History Michel Bickers, LPN; 11/15/9526 4:13 PM) Caffeine use Coffee, Tea. No alcohol use No drug use Tobacco use Former smoker.  Family History Michel Bickers, LPN; 07/19/4008 2:72 PM) Alcohol Abuse Family Members In General, Mother. Cancer Family Members In General. Cerebrovascular Accident Family Members In General. Depression Family Members In General, Mother. Heart Disease Father. Hypertension Family Members In General, Father, Mother.    Review of Systems Tresa Endo [facility] LPN; 10/16/6642 0:34 PM) General Present- Chills and Night Sweats. Not Present- Appetite Loss, Fatigue, Fever, Weight Gain and Weight Loss. Skin Not Present- Change in Wart/Mole, Dryness, Hives, Jaundice, New Lesions, Non-Healing Wounds, Rash and Ulcer. HEENT Present- Ringing in the Ears. Not Present- Earache, Hearing Loss, Hoarseness, Nose Bleed, Oral Ulcers, Seasonal Allergies, Sinus Pain, Sore Throat, Visual Disturbances, Wears glasses/contact lenses and Yellow Eyes. Respiratory Present- Snoring. Not Present- Bloody sputum, Chronic Cough, Difficulty Breathing and Wheezing. Breast Not Present- Breast Mass, Breast Pain, Nipple Discharge and Skin Changes. Cardiovascular  Present- Leg Cramps and Swelling of Extremities. Not Present- Chest Pain, Difficulty Breathing Lying Down, Palpitations, Rapid Heart Rate and Shortness of Breath. Gastrointestinal Present- Abdominal Pain. Not Present- Bloating, Bloody Stool, Change in Bowel Habits, Chronic diarrhea, Constipation, Difficulty Swallowing, Excessive gas, Gets full quickly at meals, Hemorrhoids, Indigestion, Nausea, Rectal Pain and Vomiting. Male Genitourinary Present- Frequency. Not Present- Blood in Urine, Change in Urinary Stream, Impotence, Nocturia, Painful Urination, Urgency and Urine Leakage. Neurological Not Present- Decreased Memory, Fainting, Headaches, Numbness, Seizures, Tingling, Tremor, Trouble walking and Weakness. Psychiatric Not Present- Anxiety, Bipolar, Change in Sleep Pattern, Depression, Fearful and Frequent crying. Endocrine Not Present- Cold Intolerance, Excessive Hunger, Hair Changes, Heat Intolerance, Hot flashes and New Diabetes. Hematology Not Present- Blood Thinners, Easy Bruising, Excessive bleeding, Gland problems, HIV and Persistent Infections.  Vitals Tresa Endo Dockery LPN; 12/16/2593 6:38 PM) 01/16/2016 1:40 PM Weight: 238 lb Height: 74in Body Surface Area: 2.34 m Body Mass Index: 30.56 kg/m  Pulse: 83 (Regular)  BP: 124/86 (Sitting, Left Arm, Standard)       Physical Exam Malachi Carl MD; 01/16/2016 2:32 PM) General Mental Status-Alert. General Appearance-Cooperative. Orientation-Oriented X4. Build & Nutrition-Obese. Posture-Normal posture.  Integumentary Global Assessment Normal Exam - Head/Face: no rashes, ulcers, lesions or evidence of photo damage. No palpable nodules or masses and Neck: no visible lesions or palpable masses.  Head and Neck Head-normocephalic, atraumatic with no lesions or palpable masses. Face Global Assessment - atraumatic. Thyroid Gland Characteristics - normal size and consistency.  Eye Eyeball - Bilateral-Extraocular  movements intact. Sclera/Conjunctiva - Bilateral-No scleral icterus, No Discharge.  ENMT Nose and Sinuses Nose - no deformities observed, no swelling present.  Chest and Lung Exam Palpation Normal exam - Non-tender. Auscultation Breath sounds - Normal.  Cardiovascular Auscultation Rhythm - Regular. Heart Sounds - S1 WNL and S2 WNL. Carotid arteries - No Carotid bruit.  Abdomen Inspection Normal Exam - No Visible peristalsis, No Abnormal pulsations and No  Paradoxical movements. Palpation/Percussion Normal exam - Soft, Non Tender, No Rebound tenderness, No Rigidity (guarding), No hepatosplenomegaly and No Palpable abdominal masses. Note: no palpable defect in the right groin, small palpable defect in the left groin reproducing the pain.   Peripheral Vascular Upper Extremity Palpation - Pulses bilaterally normal. Lower Extremity Palpation - Edema - Bilateral - No edema.  Neurologic Neurologic evaluation reveals -normal sensation and normal coordination.  Neuropsychiatric Mental status exam performed with findings of-able to articulate well with normal speech/language, rate, volume and coherence and thought content normal with ability to perform basic computations and apply abstract reasoning.  Musculoskeletal Normal Exam - Bilateral-Upper Extremity Strength Normal and Lower Extremity Strength Normal.    Assessment & Plan Malachi Carl(Luke A. Kinsinger MD; 01/16/2016 2:32 PM) LEFT INGUINAL HERNIA (K40.90) Impression: 35 yo male with symptomatic left groin hernia. I reviewed the CT scan and US performed. The patient has a symptomatic reducible hernia. We discussed the etiology of his hernia, the risk of it enlarging, incarceration, obstruction, strangulation, and that it is unlikely to get smaller or better on its own. We discussed operative options of laparoscopic vs open repair with mesh including the risks of recurrence, injury to intestines or abominal organs, chronic pain  associated with mesh, ischemic orchiditis or testicular injury requiring orchiectomy. We decided to proceed with laparoscopic left inguinal hernia repair with mesh. Current Plans You are being scheduled for surgery - Our schedulers will call you.  You should hear from our office's scheduling department within 5 working days about the location, date, and time of surgery. We try to make accommodations for patient's preferences in scheduling surgery, but sometimes the OR schedule or the surgeon's schedule prevents us from making those accommodations.  If you have not heard from our office [phone]) in 5 working days, call the office and ask for your surgeon's nurse.  If you have other questions about your diagnosis, plan, or surgery, call the office and ask for your surgeon's nurse.  The anatomy & physiology of the abdominal wall and pelvic floor was discussed. The pathophysiology of hernias in the inguinal and pelvic region was discussed. Natural history risks such as progressive enlargement, pain, incarceration, and strangulation was discussed. Contributors to complications such as smoking, obesity, diabetes, prior surgery, etc were discussed.  I feel the risks of no intervention will lead to serious problems that outweigh the operative risks; therefore, I recommended surgery to reduce and repair the hernia. I explained laparoscopic techniques with possible need for an open approach. I noted usual use of mesh to patch and/or buttress hernia repair  Risks such as bleeding, infection, abscess, need for further treatment, heart attack, death, and other risks were discussed. I noted a good likelihood this will help address the problem. Goals of post-operative recovery were discussed as well. Possibility that this will not correct all symptoms was explained. I stressed the importance of low-impact activity, aggressive pain control, avoiding constipation, & not pushing through pain to minimize risk of  post-operative chronic pain or injury. Possibility of reherniation was discussed. We will work to minimize complications.  An educational handout further explaining the pathology & treatment options was given as well. Questions were answered. The patient expresses understanding & wishes to proceed with surgery.  Pt Education - Pamphlet Given - Hernia Surgery: discussed with patient and provided information.

## 2016-02-10 NOTE — Anesthesia Preprocedure Evaluation (Signed)
Anesthesia Evaluation  Patient identified by MRN, date of birth, ID band Patient awake    Reviewed: Allergy & Precautions, NPO status , Patient's Chart, lab work & pertinent test results  History of Anesthesia Complications (+) PROLONGED EMERGENCE and history of anesthetic complications  Airway Mallampati: II  TM Distance: >3 FB Neck ROM: Full    Dental no notable dental hx.    Pulmonary neg pulmonary ROS, former smoker,    Pulmonary exam normal breath sounds clear to auscultation       Cardiovascular hypertension, Normal cardiovascular exam Rhythm:Regular Rate:Normal     Neuro/Psych  Headaches, PSYCHIATRIC DISORDERS Anxiety Depression    GI/Hepatic Neg liver ROS, GERD  ,  Endo/Other  negative endocrine ROS  Renal/GU negative Renal ROS  negative genitourinary   Musculoskeletal  (+) Arthritis ,   Abdominal (+) + obese,   Peds negative pediatric ROS (+)  Hematology negative hematology ROS (+)   Anesthesia Other Findings   Reproductive/Obstetrics negative OB ROS                             Anesthesia Physical Anesthesia Plan  ASA: II  Anesthesia Plan: General   Post-op Pain Management:    Induction: Intravenous  Airway Management Planned: Oral ETT  Additional Equipment:   Intra-op Plan:   Post-operative Plan: Extubation in OR  Informed Consent: I have reviewed the patients History and Physical, chart, labs and discussed the procedure including the risks, benefits and alternatives for the proposed anesthesia with the patient or authorized representative who has indicated his/her understanding and acceptance.   Dental advisory given  Plan Discussed with: CRNA  Anesthesia Plan Comments:         Anesthesia Quick Evaluation

## 2016-02-11 ENCOUNTER — Encounter (HOSPITAL_BASED_OUTPATIENT_CLINIC_OR_DEPARTMENT_OTHER): Payer: Self-pay | Admitting: General Surgery

## 2016-02-11 NOTE — Anesthesia Postprocedure Evaluation (Signed)
Anesthesia Post Note  Patient: Ronald SheetsJonathan Carpenter  Procedure(s) Performed: Procedure(s) (LRB): LAPAROSCOPIC LEFT INGUINAL HERNIA REPAIR WITH MESH (Left) INSERTION OF MESH (Left)  Patient location during evaluation: PACU Anesthesia Type: General Level of consciousness: awake Pain management: pain level controlled Vital Signs Assessment: post-procedure vital signs reviewed and stable Respiratory status: spontaneous breathing Cardiovascular status: stable Anesthetic complications: no    Last Vitals:  Vitals:   02/10/16 1730 02/10/16 1815  BP: 121/84 121/85  Pulse: 69 60  Resp: (!) 23 20  Temp:  36.8 C    Last Pain:  Vitals:   02/10/16 1815  TempSrc:   PainSc: 5                  EDWARDS,Kylo Gavin

## 2016-09-06 ENCOUNTER — Emergency Department (HOSPITAL_COMMUNITY)
Admission: EM | Admit: 2016-09-06 | Discharge: 2016-09-07 | Disposition: A | Discharge status: Home / Self Care | Attending: Emergency Medicine | Admitting: Emergency Medicine

## 2016-09-06 ENCOUNTER — Encounter (HOSPITAL_COMMUNITY): Payer: Self-pay | Admitting: Oncology

## 2016-09-06 DIAGNOSIS — I10 Essential (primary) hypertension: Secondary | ICD-10-CM | POA: Insufficient documentation

## 2016-09-06 DIAGNOSIS — R21 Rash and other nonspecific skin eruption: Secondary | ICD-10-CM | POA: Diagnosis not present

## 2016-09-06 DIAGNOSIS — Z87891 Personal history of nicotine dependence: Secondary | ICD-10-CM | POA: Insufficient documentation

## 2016-09-06 DIAGNOSIS — Z79899 Other long term (current) drug therapy: Secondary | ICD-10-CM | POA: Diagnosis not present

## 2016-09-06 NOTE — ED Triage Notes (Signed)
Pt c/o generalized rash to b/l legs, feet, arms, back.  Rash around left ankle has areas w/ vesicles.  Pt states rash has been present x 4 days.  States [facility] that was treated w/ doxycycline, penicillin and a cream he does not remember the name of.

## 2016-09-07 MED ORDER — CEPHALEXIN 500 MG PO CAPS: 500.0000 mg | ORAL_CAPSULE | Freq: Four times a day (QID) | ORAL | 0 refills | Status: DC

## 2016-09-07 MED ORDER — METHYLPREDNISOLONE SODIUM SUCC 125 MG IJ SOLR
125.0000 mg | Freq: Once | INTRAMUSCULAR | Status: AC
  Administered 2016-09-07: 125 mg via INTRAVENOUS
  Filled 2016-09-07: qty 2

## 2016-09-07 MED ORDER — PREDNISONE 10 MG (21) PO TBPK: ORAL_TABLET | ORAL | 0 refills | Status: DC

## 2016-09-07 MED ORDER — CEPHALEXIN 500 MG PO CAPS
500.0000 mg | ORAL_CAPSULE | Freq: Once | ORAL | Status: AC
  Administered 2016-09-07: 500 mg via ORAL
  Filled 2016-09-07: qty 1

## 2016-09-07 NOTE — Discharge Instructions (Signed)
The rash appears to be due to contact with an irritant or allergen. Take the prednisone, as directed, until gone.  Please take all of your antibiotics until finished!   You may develop abdominal discomfort or diarrhea from the antibiotic.  You may help offset this with probiotics which you can buy or get in yogurt. Do not eat or take the probiotics until 2 hours after your antibiotic.   You have been given the first dose for both of the above types of medications while here in the ED. Begin taking the prescribed medications later this morning (3/26).  Follow up with a primary care provider on this matter. Follow up with an allergist or dermatologist for recurrent issues.

## 2016-09-07 NOTE — ED Provider Notes (Signed)
[facility]-EMERGENCY DEPT Provider Note   CSN: [id number] Arrival date & time: 09/06/16  2330  By signing my name below, I, Doreatha MartinEva Mathews, attest that this documentation has been prepared under the direction and in the presence of Douglass Dunshee, PA-C. Electronically Signed: Doreatha MartinEva Mathews, ED Scribe. 09/07/16. 12:49 AM.    History   Chief Complaint Chief Complaint  Patient presents with  . Rash    HPI Esperanza SheetsJonathan Honea is a 36 y.o. male who presents to the Emergency Department complaining of pruritic generalized rash that began 4 days ago. Rash originally began on his left ankle and it gradually spread throughout the extremities after about 3 days. He notes his itching is worse at night. He has recently spent time in the woods hunting and cutting down trees. He does admit to wearing shorts and low socks during these excursions. No recent travel. No new socks, lotions, detergents, personal products. He notes some fluid-filled blister-like lesions with clear drainage. He denies painful lesions, cough, fever, purulent drainage, rashes to the palms, soles, or genitals, mouth sores, hearing loss or changes, ear pain or pruritus, or any other complaints.   The nurse's note regarding the patient's previous rash and treatment with antibiotics was discussed. Patient states that with that situation he had a painful, non-pruritic, oozing wound with surrounding redness. The description and the treatment that the patient states he received seems to be consistent with a cellulitis surrounding a wound or abscess.    The history is provided by the patient. No language interpreter was used.    Past Medical History:  Diagnosis Date  . Anxiety   . Arthritis    knees, ankles  . Complication of anesthesia    PT  NARCOLEPTIC--  "HARD TO GET TO SLEEP AND HARD TO WAKE"  . Depression   . GERD (gastroesophageal reflux disease)   . 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  Hypertension   . Left inguinal hernia   . Light sensitivity   . Migraine    w/ light sensitivity  -- TCI residual  . Narcolepsy     Patient Active Problem List   Diagnosis Date Noted  . Left inguinal hernia 01/22/2016  . Hypertension 01/06/2016  . Narcolepsy 01/06/2016  . Anxiety 01/06/2016  . Depression 01/06/2016    Past Surgical History:  Procedure Laterality Date  . ANKLE SURGERY Left 2010   removal bone fragment  . CIRCUMCISION  2008  . INGUINAL HERNIA REPAIR Left 02/10/2016   Procedure: LAPAROSCOPIC LEFT INGUINAL HERNIA REPAIR WITH MESH;  Surgeon: De BlanchLuke Aaron Kinsinger, MD;  [facility];  Service: General;  Laterality: Left;  . INSERTION OF MESH Left 02/10/2016   Procedure: INSERTION OF MESH;  Surgeon: Rodman PickleLuke Aaron Kinsinger, MD;  [facility] [place];  Service: General;  Laterality: Left;  . LAPAROSCOPIC CHOLECYSTECTOMY  03/2014  . UVULOPALATOPHARYNGOPLASTY  2009   w/ Tonsillectomy and Adenoidectomy       Home Medications    Prior to Admission medications   Medication Sig Start Date End Date Taking? Authorizing Provider  cephALEXin (KEFLEX) 500 MG capsule Take 1 capsule (500 mg total) by mouth 4 (four) times daily. 09/07/16   Ethleen Lormand C Catheryne Deford, PA-C  Dihydroergotamine Mesylate (MIGRANAL NA) Place into the nose as needed (migraine).    Historical Provider, MD  esomeprazole (NEXIUM) 20 MG capsule Take 20 mg by mouth as needed.    Historical Provider, MD  HYDROcodone-acetaminophen (NORCO/VICODIN) 5-325 MG tablet Take 1-2 tablets by mouth every 6 (six) hours as needed for moderate pain. 02/10/16   Rodman Pickle, MD  ibuprofen (ADVIL,MOTRIN) 800 MG tablet Take 1 tablet (800 mg total) by mouth every 8 (eight) hours as needed. 02/10/16   Rodman Pickle, MD  methylphenidate (RITALIN LA) 30 MG 24 hr capsule Take 30 mg by mouth 2 (two) times daily. AM UPON WAKING  AND AT LUNCH TIME    Historical Provider, MD  methylphenidate (RITALIN) 5  MG tablet Take 5 mg by mouth 3 (three) times daily as needed (For narcolepsy.).     Historical Provider, MD  predniSONE (STERAPRED UNI-PAK 21 TAB) 10 MG (21) TBPK tablet Take 6 tabs by mouth daily  for 2 days, then 5 tabs for 2 days, then 4 tabs for 2 days, then 3 tabs for 2 days, 2 tabs for 2 days, then 1 tab by mouth daily for 2 days 09/07/16   Kiera Hussey C Shayleigh Bouldin, PA-C  propranolol (INDERAL) 10 MG tablet Take 10 mg by mouth 3 (three) times daily.     Historical Provider, MD  Sodium Oxybate (XYREM) 500 MG/ML SOLN Take 3,750 mg by mouth at bedtime.     Historical Provider, MD  telmisartan-hydrochlorothiazide (MICARDIS HCT) 40-12.5 MG per tablet Take 1 tablet by mouth every morning.    Historical Provider, MD  traMADol (ULTRAM) 50 MG tablet Take 1 tablet (50 mg total) by mouth every 8 (eight) hours as needed. 01/08/16   Morrell Riddle, PA-C  venlafaxine XR (EFFEXOR-XR) 75 MG 24 hr capsule Take 75 mg by mouth daily with breakfast.    Historical Provider, MD    Family History Family History  Problem Relation Age of Onset  . Hypertension Mother   . Stroke Mother   . Hypertension Father     Social History Social History  Substance Use Topics  . Smoking status: Former Smoker    Years: 10.00    Types: Cigarettes    Quit date: 02/02/2014  . Smokeless tobacco: Current User    Types: Snuff  . Alcohol use No     Allergies   Patient has no known allergies.   Review of Systems Review of Systems  Constitutional: Negative for fever.  HENT: Negative for ear pain, hearing loss and mouth sores.   Respiratory: Negative for cough and shortness of breath.   Gastrointestinal: Negative for abdominal pain, diarrhea, nausea and vomiting.  Genitourinary: Negative for genital sores.  Skin: Positive for rash.  All other systems reviewed and are negative.   Physical Exam Updated Vital Signs Pulse 75   Temp 98 F (36.7 C) (Oral)   Resp 20   Wt 240 lb (108.9 kg)   SpO2 98%   BMI 30.81 kg/m   Physical  Exam  Constitutional: He appears well-developed and well-nourished.  HENT:  Head: Normocephalic.  Eyes: Conjunctivae are normal.  Cardiovascular: Normal rate.   Pulmonary/Chest: Effort normal. No respiratory distress.  Abdominal: He exhibits no distension.  Musculoskeletal: Normal range of motion.  Neurological: He is alert.  Skin: Skin is warm and dry. Capillary refill takes less than 2 seconds. Rash noted. There is erythema.  Raised erythematous scattered lesions across the BLE, chest, abdomen and BUE. Evidence of vesicles on the left lower extremity with what appears to be clear serous fluid. There is evidence of excoriations on the bilateral lower extremities. On the anterior left ankle, there appears to be a line of lesions just above the  level where patient states his socks usually end.  Rash covers both lower extremities to the upper thighs, the chest and abdomen, upper extremities. Appears to spare the face, neck, back, palms, soles and genitals. There are no intraoral lesions.   Psychiatric: He has a normal mood and affect. His behavior is normal.  Nursing note and vitals reviewed.    ED Treatments / Results   DIAGNOSTIC STUDIES: Oxygen Saturation is 98% on RA, normal by my interpretation.    COORDINATION OF CARE: 12:39 AM Discussed treatment plan with pt at bedside which includes steroids, antibiotic and pt agreed to plan.    Labs (all labs ordered are listed, but only abnormal results are displayed) Labs Reviewed - No data to display  EKG  EKG Interpretation None       Radiology No results found.  Procedures Procedures (including critical care time)  Medications Ordered in ED Medications  methylPREDNISolone sodium succinate (SOLU-MEDROL) 125 mg/2 mL injection 125 mg (125 mg Intravenous Given 09/07/16 0129)  cephALEXin (KEFLEX) capsule 500 mg (500 mg Oral Given 09/07/16 0129)     Initial Impression / Assessment and Plan / ED Course  I have reviewed the  triage vital signs and the nursing notes.  Pertinent labs & imaging results that were available during my care of the patient were reviewed by me and considered in my medical decision making (see chart for details).     Patient presents with a rash that appears to be consistent with contact dermatitis with Rhus dermatitis being a probable candidate given the patient's recent outdoor activity. He is nontoxic appearing and vitals do no indicate signs of systemic infection, however, he has a small area of redness surrounding the left ankle lesions and excoriations that may be the start of a cellulitis. If this is a cellulitis, I suspect it is likely secondary to that patient scratching the area, rather than the primary cause of his chief complaint today.  PCP follow up. The patient was given instructions for home care as well as return precautions. Patient voices understanding of these instructions, accepts the plan, and is comfortable with discharge.  Vitals:   09/06/16 2337 09/07/16 0132  BP:  (!) 154/90  Pulse: 75 76  Resp: 20 18  Temp: 98 F (36.7 C)   TempSrc: Oral   SpO2: 98%   Weight: 108.9 kg        Final Clinical Impressions(s) / ED Diagnoses   Final diagnoses:  Rash    New Prescriptions Discharge Medication List as of 09/07/2016  1:24 AM    START taking these medications   Details  cephALEXin (KEFLEX) 500 MG capsule Take 1 capsule (500 mg total) by mouth 4 (four) times daily., Starting Mon 09/07/2016, Print    predniSONE (STERAPRED UNI-PAK 21 TAB) 10 MG (21) TBPK tablet Take 6 tabs by mouth daily  for 2 days, then 5 tabs for 2 days, then 4 tabs for 2 days, then 3 tabs for 2 days, 2 tabs for 2 days, then 1 tab by mouth daily for 2 days, Print        I personally performed the services described in this documentation, which was scribed in my presence. The recorded information has been reviewed and is accurate.   Anselm Pancoast, PA-C 09/10/16 2137    Paula Libra,  MD 09/11/16 2232

## 2016-10-04 ENCOUNTER — Emergency Department (HOSPITAL_COMMUNITY)
Admission: EM | Admit: 2016-10-04 | Discharge: 2016-10-04 | Disposition: A | Discharge status: Home / Self Care | Attending: Emergency Medicine | Admitting: Emergency Medicine

## 2016-10-04 ENCOUNTER — Encounter (HOSPITAL_COMMUNITY): Payer: Self-pay | Admitting: *Deleted

## 2016-10-04 DIAGNOSIS — H578 Other specified disorders of eye and adnexa: Secondary | ICD-10-CM | POA: Diagnosis not present

## 2016-10-04 DIAGNOSIS — H5789 Other specified disorders of eye and adnexa: Secondary | ICD-10-CM

## 2016-10-04 DIAGNOSIS — I10 Essential (primary) hypertension: Secondary | ICD-10-CM | POA: Diagnosis not present

## 2016-10-04 DIAGNOSIS — H5713 Ocular pain, bilateral: Secondary | ICD-10-CM | POA: Insufficient documentation

## 2016-10-04 DIAGNOSIS — Z87891 Personal history of nicotine dependence: Secondary | ICD-10-CM | POA: Diagnosis not present

## 2016-10-04 MED ORDER — POLYMYXIN B-TRIMETHOPRIM 10000-0.1 UNIT/ML-% OP SOLN: 1.0000 [drp] | OPHTHALMIC | 0 refills | Status: DC

## 2016-10-04 MED ORDER — KETOROLAC TROMETHAMINE 0.5 % OP SOLN: 1.0000 [drp] | Freq: Four times a day (QID) | OPHTHALMIC | 0 refills | Status: DC

## 2016-10-04 NOTE — Discharge Instructions (Signed)
Take the prescribed medication as directed.  Keep eyes clean with warm cloth.  Try not to touch your eyes. Follow-up with Dr. Randon Goldsmith if you have any ongoing issues. Return to the ED for new or worsening symptoms.

## 2016-10-04 NOTE — ED Triage Notes (Signed)
Pt states left eye irritation that is now starting in the right eye. Feels like he is having some swelling in the left eye as well.

## 2016-10-04 NOTE — ED Provider Notes (Signed)
[facility]-EMERGENCY DEPT Provider Note   CSN: [id number] Arrival date & time: 10/04/16  2137     History   Chief Complaint Chief Complaint  Patient presents with  . Eye Problem    HPI Ronald Carpenter is a 36 y.o. male.  The history is provided by the patient and medical records.  Eye Problem   Associated symptoms include eye redness.     36 year old male with history of anxiety, arthritis, depression, GERD, history of TBI, light sensitivity, migraines, presenting to the ED with bilateral eye discomfort. Patient states this initially started in his left eye about 2 days ago, but now is having symptoms in the right eye as well.  States eyes feel scratchy and are burning.  He denies any tearing or drainage.  No sneezing, sore throat, or other allergic symptoms.  Denies hx of seasonal allergies.  No blurred vision.  Wears glasses for reading, no contact lenses.  Past Medical History:  Diagnosis Date  . Anxiety   . Arthritis    knees, ankles  . Complication of anesthesia    PT  NARCOLEPTIC--  "HARD TO GET TO SLEEP AND HARD TO WAKE"  . Depression   . GERD (gastroesophageal reflux disease)   . 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 Hypertension   . Left inguinal hernia   . Light sensitivity   . Migraine    w/ light sensitivity  -- TCI residual  . Narcolepsy     Patient Active Problem List   Diagnosis Date Noted  . Left inguinal hernia 01/22/2016  . Hypertension 01/06/2016  . Narcolepsy 01/06/2016  . Anxiety 01/06/2016  . Depression 01/06/2016    Past Surgical History:  Procedure Laterality Date  . ANKLE SURGERY Left 2010   removal bone fragment  . CIRCUMCISION  2008  . INGUINAL HERNIA REPAIR Left 02/10/2016   Procedure: LAPAROSCOPIC LEFT INGUINAL HERNIA REPAIR WITH MESH;  Surgeon: De Blanch Kinsinger, MD;  Location: [facility];  Service: General;  Laterality: Left;  . INSERTION OF MESH Left 02/10/2016   Procedure: INSERTION OF MESH;  Surgeon: Rodman Pickle, MD;  Location: [facility];  Service: General;  Laterality: Left;  . LAPAROSCOPIC CHOLECYSTECTOMY  03/2014  . UVULOPALATOPHARYNGOPLASTY  2009   w/ Tonsillectomy and Adenoidectomy       Home Medications    Prior to Admission medications   Medication Sig Start Date End Date Taking? Authorizing Provider  cephALEXin (KEFLEX) 500 MG capsule Take 1 capsule (500 mg total) by mouth 4 (four) times daily. 09/07/16   Shawn C Joy, PA-C  Dihydroergotamine Mesylate (MIGRANAL NA) Place into the nose as needed (migraine).    Historical Provider, MD  esomeprazole (NEXIUM) 20 MG capsule Take 20 mg by mouth as needed.    Historical Provider, MD  HYDROcodone-acetaminophen (NORCO/VICODIN) 5-325 MG tablet Take 1-2 tablets by mouth every 6 (six) hours as needed for moderate pain. 02/10/16   Rodman Pickle, MD  ibuprofen (ADVIL,MOTRIN) 800 MG tablet Take 1 tablet (800 mg total) by mouth every 8 (eight) hours as needed. 02/10/16   Rodman Pickle, MD  methylphenidate (RITALIN LA) 30 MG 24 hr capsule Take 30 mg by mouth 2 (two) times daily. AM UPON WAKING  AND AT LUNCH TIME    Historical Provider, MD  methylphenidate (RITALIN) 5 MG tablet Take 5 mg by mouth 3 (three) times daily as needed (For narcolepsy.).  Historical Provider, MD  predniSONE (STERAPRED UNI-PAK 21 TAB) 10 MG (21) TBPK tablet Take 6 tabs by mouth daily  for 2 days, then 5 tabs for 2 days, then 4 tabs for 2 days, then 3 tabs for 2 days, 2 tabs for 2 days, then 1 tab by mouth daily for 2 days 09/07/16   Shawn C Joy, PA-C  propranolol (INDERAL) 10 MG tablet Take 10 mg by mouth 3 (three) times daily.     Historical Provider, MD  Sodium Oxybate (XYREM) 500 MG/ML SOLN Take 3,750 mg by mouth at bedtime.     Historical Provider, MD  telmisartan-hydrochlorothiazide (MICARDIS HCT) 40-12.5 MG per tablet Take 1 tablet by mouth every morning.    Historical Provider, MD  traMADol  (ULTRAM) 50 MG tablet Take 1 tablet (50 mg total) by mouth every 8 (eight) hours as needed. 01/08/16   Morrell Riddle, PA-C  venlafaxine XR (EFFEXOR-XR) 75 MG 24 hr capsule Take 75 mg by mouth daily with breakfast.    Historical Provider, MD    Family History Family History  Problem Relation Age of Onset  . Hypertension Mother   . Stroke Mother   . Hypertension Father     Social History Social History  Substance Use Topics  . Smoking status: Former Smoker    Years: 10.00    Types: Cigarettes    Quit date: 02/02/2014  . Smokeless tobacco: Current User    Types: Snuff  . Alcohol use No     Allergies   Patient has no known allergies.   Review of Systems Review of Systems  Eyes: Positive for pain and redness.  All other systems reviewed and are negative.    Physical Exam Updated Vital Signs BP 134/89   Pulse 80   Temp 98.3 F (36.8 C) (Oral)   Resp 18   Ht  (1.88 m)   Wt 104.3 kg   SpO2 94%   BMI 29.53 kg/m   Physical Exam  Constitutional: He is oriented to person, place, and time. He appears well-developed and well-nourished.  HENT:  Head: Normocephalic and atraumatic.  Right Ear: Tympanic membrane and ear canal normal.  Left Ear: Tympanic membrane and ear canal normal.  Nose: Nose normal. Right sinus exhibits no maxillary sinus tenderness and no frontal sinus tenderness. Left sinus exhibits no maxillary sinus tenderness and no frontal sinus tenderness.  Mouth/Throat: Uvula is midline, oropharynx is clear and moist and mucous membranes are normal.  Eyes: Conjunctivae and EOM are normal. Pupils are equal, round, and reactive to light.  No lid edema or erythema, both conjunctiva are injected along the medial aspects, there is no tearing or drainage, EOMs fully intact and nonpainful, pupils symmetric and reactive bilaterally, no evidence of body of trauma  Neck: Normal range of motion.  Cardiovascular: Normal rate, regular rhythm and normal heart sounds.     Pulmonary/Chest: Effort normal and breath sounds normal.  Abdominal: Soft. Bowel sounds are normal.  Musculoskeletal: Normal range of motion.  Neurological: He is alert and oriented to person, place, and time.  Skin: Skin is warm and dry.  Psychiatric: He has a normal mood and affect.  Nursing note and vitals reviewed.    ED Treatments / Results  Labs (all labs ordered are listed, but only abnormal results are displayed) Labs Reviewed - No data to display  EKG  EKG Interpretation None       Radiology No results found.  Procedures Procedures (including critical care time)  Medications Ordered in ED Medications - No data to display   Initial Impression / Assessment and Plan / ED Course  I have reviewed the triage vital signs and the nursing notes.  Pertinent labs & imaging results that were available during my care of the patient were reviewed by me and considered in my medical decision making (see chart for details).  36 year old male here with bilateral eye irritation. Started in left eye now progressed to right. No lid edema or erythema on exam suggestive of orbital preseptal cellulitis. He does have some mild conjunctival injection along the medial aspects of both eyes. No tearing or drainage. EOMs fully intact and nonpainful. Pupils symmetric and reactive bilaterally. Denies visual disturbance. Remainder of exam is benign. Symptoms may represent early conjunctivitis. He denies any other allergic type symptoms. Will start on Polytrim and Acular drops. He was referred to on-call ophthalmology if any acute problems arise.  Discussed plan with patient, he acknowledged understanding and agreed with plan of care.  Return precautions given for new or worsening symptoms.  Final Clinical Impressions(s) / ED Diagnoses   Final diagnoses:  Eye pain, bilateral  Eye redness    New Prescriptions Discharge Medication List as of 10/04/2016 10:33 PM    START taking these  medications   Details  ketorolac (ACULAR) 0.5 % ophthalmic solution Place 1 drop into both eyes every 6 (six) hours., Starting Sun 10/04/2016, Print    trimethoprim-polymyxin b (POLYTRIM) ophthalmic solution Place 1 drop into both eyes every 4 (four) hours., Starting Sun 10/04/2016, Print         Garlon Hatchet, PA-C 10/04/16 2256    Rolland Porter, MD 10/17/16 [id number]

## 2016-10-27 ENCOUNTER — Encounter (HOSPITAL_COMMUNITY): Payer: Self-pay

## 2016-10-27 ENCOUNTER — Emergency Department (HOSPITAL_COMMUNITY)

## 2016-10-27 DIAGNOSIS — Y999 Unspecified external cause status: Secondary | ICD-10-CM | POA: Diagnosis not present

## 2016-10-27 DIAGNOSIS — Y93G3 Activity, cooking and baking: Secondary | ICD-10-CM | POA: Diagnosis not present

## 2016-10-27 DIAGNOSIS — Z87891 Personal history of nicotine dependence: Secondary | ICD-10-CM | POA: Diagnosis not present

## 2016-10-27 DIAGNOSIS — S8992XA Unspecified injury of left lower leg, initial encounter: Secondary | ICD-10-CM | POA: Diagnosis present

## 2016-10-27 DIAGNOSIS — I10 Essential (primary) hypertension: Secondary | ICD-10-CM | POA: Diagnosis not present

## 2016-10-27 DIAGNOSIS — Y929 Unspecified place or not applicable: Secondary | ICD-10-CM | POA: Insufficient documentation

## 2016-10-27 DIAGNOSIS — Z79899 Other long term (current) drug therapy: Secondary | ICD-10-CM | POA: Insufficient documentation

## 2016-10-27 DIAGNOSIS — S838X2A Sprain of other specified parts of left knee, initial encounter: Secondary | ICD-10-CM | POA: Insufficient documentation

## 2016-10-27 DIAGNOSIS — W208XXA Other cause of strike by thrown, projected or falling object, initial encounter: Secondary | ICD-10-CM | POA: Diagnosis not present

## 2016-10-27 MED ORDER — OXYCODONE-ACETAMINOPHEN 5-325 MG PO TABS
1.0000 | ORAL_TABLET | ORAL | Status: DC | PRN
  Administered 2016-10-27: 1 via ORAL
  Filled 2016-10-27: qty 1

## 2016-10-27 NOTE — ED Triage Notes (Signed)
PT C/O LEFT KNEE PAIN WHILE TRYING TO TRANSFER WEIGHT FROM THE RIGHT LEG. PT STS HE FELT A "CRUNCH" AND HEARD A "POP".

## 2016-10-28 ENCOUNTER — Emergency Department (HOSPITAL_COMMUNITY)
Admission: EM | Admit: 2016-10-28 | Discharge: 2016-10-28 | Disposition: A | Discharge status: Home / Self Care | Attending: Emergency Medicine | Admitting: Emergency Medicine

## 2016-10-28 DIAGNOSIS — S838X2A Sprain of other specified parts of left knee, initial encounter: Secondary | ICD-10-CM

## 2016-10-28 MED ORDER — HYDROCODONE-ACETAMINOPHEN 5-325 MG PO TABS
1.0000 | ORAL_TABLET | Freq: Four times a day (QID) | ORAL | 0 refills | Status: DC | PRN
Start: 2016-10-28 — End: 2017-05-10

## 2016-10-28 NOTE — Discharge Instructions (Signed)
Continue taking your daily anti-inflammatory. You may take Norco as needed for severe pain. Apply ice 3-4 times per day. Wear a knee sleeve for stability. Follow-up with an orthopedist to ensure resolution of symptoms. You may return to the emergency department, as needed, for new or concerning symptoms.

## 2016-10-28 NOTE — ED Provider Notes (Signed)
[facility]-EMERGENCY DEPT Provider Note   CSN: [id number] Arrival date & time: 10/27/16  2152  By signing my name below, Alexia Julien GirtPerkins, attest that this documentation has been prepared under the direction and in the presence of non physician practitioner, Antony MaduraKelly Kaitrin Seybold, PA-C.    Electronically Signed: Sandrea HammondAlexia Perkins, Scribe 10/28/2016. 1:28 AM.    History   Chief Complaint Chief Complaint  Patient presents with  . Knee Pain    LEFT    The history is provided by the patient. No language interpreter was used.  Knee Pain   This is a new problem. The problem occurs constantly. The problem has not changed since onset.The pain is present in the left knee. The quality of the pain is described as constant. The pain is moderate. The symptoms are aggravated by activity and standing. He has tried nothing for the symptoms. There has been a history of trauma.   HPI Comments:  Ronald Carpenter is a 36 y.o. male who presents to the Emergency Department complaining of constant left knee pain that began today s/p injury. He was cooking outdoors when a grill fell on his knee. He describes his pain as a "crunching" and "pop" sensation. He states his pain is exacerbated by weight bearing. Pt used ice PTA. PMHx of left knee injury, has not followed up with an orthopedist. He has a RLE brace due to PSx right foot surgery. He is currently taking 500 mg of naproxen daily.   Past Medical History:  Diagnosis Date  . Anxiety   . Arthritis    knees, ankles  . Complication of anesthesia    PT  NARCOLEPTIC--  "HARD TO GET TO SLEEP AND HARD TO WAKE"  . Depression   . GERD (gastroesophageal reflux disease)   . 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 Hypertension   . Left inguinal hernia   . Light sensitivity   . Migraine    w/ light sensitivity  -- TCI residual  . Narcolepsy     Patient Active Problem List   Diagnosis Date Noted  . Left inguinal hernia 01/22/2016    . Hypertension 01/06/2016  . Narcolepsy 01/06/2016  . Anxiety 01/06/2016  . Depression 01/06/2016    Past Surgical History:  Procedure Laterality Date  . ANKLE SURGERY Left 2010   removal bone fragment  . CIRCUMCISION  2008  . INGUINAL HERNIA REPAIR Left 02/10/2016   Procedure: LAPAROSCOPIC LEFT INGUINAL HERNIA REPAIR WITH MESH;  Surgeon: De BlanchLuke Aaron Kinsinger, MD;  [facility];  Service: General;  Laterality: Left;  . INSERTION OF MESH Left 02/10/2016   Procedure: INSERTION OF MESH;  Surgeon: Rodman PickleLuke Aaron Kinsinger, MD;  [facility];  Service: General;  Laterality: Left;  . LAPAROSCOPIC CHOLECYSTECTOMY  03/2014  . UVULOPALATOPHARYNGOPLASTY  2009   w/ Tonsillectomy and Adenoidectomy       Home Medications    Prior to Admission medications   Medication Sig Start Date End Date Taking? Authorizing Provider  cephALEXin (KEFLEX) 500 MG capsule Take 1 capsule (500 mg total) by mouth 4 (four) times daily. 09/07/16   Joy, Shawn C, PA-C  Dihydroergotamine Mesylate (MIGRANAL NA) Place into the nose as needed (migraine).    [provider]  esomeprazole (NEXIUM) 20 MG capsule Take 20 mg by mouth as needed.    [provider]  HYDROcodone-acetaminophen (NORCO/VICODIN) 5-325 MG tablet Take 1-2 tablets by mouth every 6 (six) hours  as needed for moderate pain. 02/10/16   Kinsinger, De Blanch, MD  ibuprofen (ADVIL,MOTRIN) 800 MG tablet Take 1 tablet (800 mg total) by mouth every 8 (eight) hours as needed. 02/10/16   Kinsinger, De Blanch, MD  ketorolac (ACULAR) 0.5 % ophthalmic solution Place 1 drop into both eyes every 6 (six) hours. 10/04/16   Garlon Hatchet, PA-C  methylphenidate (RITALIN LA) 30 MG 24 hr capsule Take 30 mg by mouth 2 (two) times daily. AM UPON WAKING  AND AT LUNCH TIME    [provider]  methylphenidate (RITALIN) 5 MG tablet Take 5 mg by mouth 3 (three) times daily as needed (For narcolepsy.).     [provider]  predniSONE (STERAPRED UNI-PAK 21 TAB) 10 MG (21) TBPK tablet Take 6 tabs by mouth daily  for 2 days, then 5 tabs for 2 days, then 4 tabs for 2 days, then 3 tabs for 2 days, 2 tabs for 2 days, then 1 tab by mouth daily for 2 days 09/07/16   Joy, Shawn C, PA-C  propranolol (INDERAL) 10 MG tablet Take 10 mg by mouth 3 (three) times daily.     [provider]  Sodium Oxybate (XYREM) 500 MG/ML SOLN Take 3,750 mg by mouth at bedtime.     [provider]  telmisartan-hydrochlorothiazide (MICARDIS HCT) 40-12.5 MG per tablet Take 1 tablet by mouth every morning.    [provider]  traMADol (ULTRAM) 50 MG tablet Take 1 tablet (50 mg total) by mouth every 8 (eight) hours as needed. 01/08/16   Weber, Dema Severin, PA-C  trimethoprim-polymyxin b (POLYTRIM) ophthalmic solution Place 1 drop into both eyes every 4 (four) hours. 10/04/16   Garlon Hatchet, PA-C  venlafaxine XR (EFFEXOR-XR) 75 MG 24 hr capsule Take 75 mg by mouth daily with breakfast.    [provider]    Family History Family History  Problem Relation Age of Onset  . Hypertension Mother   . Stroke Mother   . Hypertension Father     Social History Social History  Substance Use Topics  . Smoking status: Former Smoker    Years: 10.00    Types: Cigarettes    Quit date: 02/02/2014  . Smokeless tobacco: Current User    Types: Snuff  . Alcohol use No     Allergies   Patient has no known allergies.   Review of Systems Review of Systems All other systems reviewed and negative for acute change except as noted in the HPI.   Physical Exam Updated Vital Signs BP (!) 146/98 (BP Location: Left Arm)   Pulse 86   Temp 98.6 F (37 C) (Oral)   Resp 18   Ht 6\' 2"  (1.88 m)   Wt 251 lb (113.9 kg)   SpO2 98%   BMI 32.23 kg/m   Physical Exam  Constitutional: He is oriented to person, place, and time. He appears well-developed and well-nourished. No distress.  Nontoxic and in NAD  HENT:    Head: Normocephalic and atraumatic.  Eyes: Conjunctivae and EOM are normal. No scleral icterus.  Neck: Normal range of motion.  Cardiovascular: Normal rate, regular rhythm and intact distal pulses.   DP and PT pulses 2+ in the LUE  Pulmonary/Chest: Effort normal. No respiratory distress. He has no wheezes.  Respirations even and unlabored  Musculoskeletal: Normal range of motion.  Range of motion of the left lower extremity. There is tenderness to palpation along the medial joint line of the left knee.  No crepitus or deformity. No significant effusion. Knee flexion and extension on the left is preserved. No overlying erythema or heat to touch.  Neurological: He is alert and oriented to person, place, and time. He exhibits normal muscle tone. Coordination normal.  Skin: Skin is warm and dry. No rash noted. He is not diaphoretic. No erythema. No pallor.  Psychiatric: He has a normal mood and affect. His behavior is normal.  Nursing note and vitals reviewed.    ED Treatments / Results  DIAGNOSTIC STUDIES:  Oxygen Saturation is 98% on room air, normal, by my interpretation.    COORDINATION OF CARE:  1:29 AM Discussed treatment plan with pt at bedside and pt agreed to plan. Treatment plan includes MRI of the left knee and pain medication. Knee sleeve will be given.  Labs (all labs ordered are listed, but only abnormal results are displayed) Labs Reviewed - No data to display  EKG  EKG Interpretation None       Radiology Dg Knee Complete 4 Views Left  Result Date: 10/27/2016 CLINICAL DATA:  Medial patellar knee pain.  Stood up and felt a pop. EXAM: LEFT KNEE - COMPLETE 4+ VIEW COMPARISON:  None. FINDINGS: No evidence of fracture, dislocation, or joint effusion. Probable mild medial tibiofemoral joint space narrowing and trace spurring of the tibial spines. Well-defined osseous density at the patellar insertion of the patellar tendon is chronic and may be tendinopathy or a  fragmented anterior tibial tubercle. Soft tissues are unremarkable. IMPRESSION: No acute bony abnormality. Suspect mild degenerative change with probable medial tibiofemoral joint space narrowing and spurring of the tibial spines. Electronically Signed   By: Rubye Oaks M.D.   On: 10/27/2016 23:29    Procedures Procedures (including critical care time)  Medications Ordered in ED Medications  oxyCODONE-acetaminophen (PERCOCET/ROXICET) 5-325 MG per tablet 1 tablet (1 tablet Oral Given 10/27/16 2308)     Initial Impression / Assessment and Plan / ED Course  I have reviewed the triage vital signs and the nursing notes.  Pertinent labs & imaging results that were available during my care of the patient were reviewed by me and considered in my medical decision making (see chart for details).     36 year old male present to the emergency department for evaluation of acute onset left knee pain. He has been able to weight-bear since onset of his pain. He is neurovascularly intact on exam with tenderness to palpation along the medial joint line. Concern for medial meniscal injury. Range of motion of the left knee is preserved.  Symptoms to be managed supportively with knee sleeve as x-ray without evidence of acute bony abnormality. Patient given referral to orthopedics for follow-up. RICE advised as well as continue use of NSAIDs. Return precautions discussed and provided. Patient discharged in stable condition with no unaddressed concerns.   Final Clinical Impressions(s) / ED Diagnoses   Final diagnoses:  Acute medial meniscal injury of left knee, initial encounter    New Prescriptions New Prescriptions   No medications on file    I personally performed the services described in this documentation, which was scribed in my presence. The recorded information has been reviewed and is accurate.       Antony Madura, PA-C 10/29/16 1610    Devoria Albe, MD 10/29/16 [id number]

## 2016-11-04 ENCOUNTER — Ambulatory Visit: Payer: Self-pay

## 2016-11-04 ENCOUNTER — Ambulatory Visit (INDEPENDENT_AMBULATORY_CARE_PROVIDER_SITE_OTHER): Admitting: Family Medicine

## 2016-11-04 ENCOUNTER — Encounter: Payer: Self-pay | Admitting: Family Medicine

## 2016-11-04 DIAGNOSIS — M25562 Pain in left knee: Secondary | ICD-10-CM | POA: Diagnosis not present

## 2016-11-04 NOTE — Patient Instructions (Addendum)
Good to see you.  Ice 20 minutes 2 times daily. Usually after activity and before bed. Exercises 3 times a week.  pennsaid pinkie amount topically 2 times daily as needed.  Vitamin D 2000 Iu daily  Avoid twisting or deep squating.  Keep wearing the good shoes.  See me again in 4 weeks and if not better we will consider injection.

## 2016-11-04 NOTE — Progress Notes (Signed)
[name] [address] [place], [place] [address] Phone: [phone] Subjective:     CC: left knee pain   BJY:[id number]  Ronald Carpenter is a 36 y.o. male coming in with complaint of Left knee pain. Patient discusses pain as a dull, throbbing aching pain. Has been going on for multiple weeks. Was seen in the emergency department. X-rays were taken at that time. Was showing the patient did have mild arthritic changes. Otherwise fairly unremarkable patient states that still going from a seated to standing position is severe. Seasonal medial aspect of the knee. States that once he gets going the pain gets significantly improved.     Past Medical History:  Diagnosis Date  . Anxiety   . Arthritis    knees, ankles  . Complication of anesthesia    PT  NARCOLEPTIC--  "HARD TO GET TO SLEEP AND HARD TO WAKE"  . Depression   . GERD (gastroesophageal reflux disease)   . 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 Hypertension   . Left inguinal hernia   . Light sensitivity   . Migraine    w/ light sensitivity  -- TCI residual  . Narcolepsy    Past Surgical History:  Procedure Laterality Date  . ANKLE SURGERY Left 2010   removal bone fragment  . CIRCUMCISION  2008  . INGUINAL HERNIA REPAIR Left 02/10/2016   Procedure: LAPAROSCOPIC LEFT INGUINAL HERNIA REPAIR WITH MESH;  Surgeon: De Blanch Kinsinger, MD;  Location: [facility];  Service: General;  Laterality: Left;  . INSERTION OF MESH Left 02/10/2016   Procedure: INSERTION OF MESH;  Surgeon: Rodman Pickle, MD;  Location: [facility];  Service: General;  Laterality: Left;  . LAPAROSCOPIC CHOLECYSTECTOMY  03/2014  . UVULOPALATOPHARYNGOPLASTY  2009   w/ Tonsillectomy and Adenoidectomy   Social History   Social History  . Marital status: Married    Spouse name: N/A  . Number of children: N/A  . Years of education: N/A    Occupational History  . 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 Smoking status: Former Smoker    Years: 10.00    Types: Cigarettes    Quit date: 02/02/2014  . Smokeless tobacco: Current User    Types: Snuff  . Alcohol use No  . Drug use: No  . Sexual activity: Not on file   Other Topics Concern  . Not on file   Social History Narrative  . No narrative on file   No Known Allergies Family History  Problem Relation Age of Onset  . Hypertension Mother   . Stroke Mother   . Hypertension Father     Past medical history, social, surgical and family history all reviewed in electronic medical record.  No pertanent information unless stated regarding to the chief complaint.   Review of Systems:Review of systems updated and as accurate as of 11/04/16  No headache, visual changes, nausea, vomiting, diarrhea, constipation, dizziness, abdominal pain, skin rash, fevers, chills, night sweats, weight loss, swollen lymph nodes, body aches,  chest pain, shortness of breath, mood changes.  Positive muscle aching joint swelling  Objective  There were no vitals taken for this visit.  Systems examined below as of 11/04/16   General: No apparent distress alert and oriented x3 mood and affect normal, dressed appropriately.  HEENT: Pupils equal, extraocular movements intact  Respiratory: Patient's speak in full sentences and does not appear short of breath  Cardiovascular: No lower extremity edema, non tender, no erythema  Skin: Warm dry intact with no signs of infection or rash on extremities or on axial skeleton.  Abdomen: Soft nontender  Neuro: Cranial nerves II through XII are intact, neurovascularly intact in all extremities with 2+ DTRs and 2+ pulses.  Lymph: No lymphadenopathy of posterior or anterior cervical chain or axillae bilaterally.  Gait normal with good balance and coordination.  MSK:  Non tender with full range of motion and good stability  and symmetric strength and tone of shoulders, elbows, wrist, hip, and ankles bilaterally.  Knee:Left knee  Normal to inspection with no erythema or effusion or obvious bony abnormalities. Severely tender to palpation over the medial joint line and somewhat over the pes anserine. ROM full in flexion and extension and lower leg rotation. Ligaments with solid consistent endpoints including ACL, PCL, LCL, MCL. Positive Mcmurray's, Apley's, and Thessalonian tests. Non painful patellar compression. Patellar glide without crepitus. Patellar and quadriceps tendons unremarkable. Hamstring and quadriceps strength is normal.    MSK US performed of: left knee  This study was ordered, performed, and interpreted by Terrilee FilesZach Zabrina Brotherton D.O.  Knee: All structures visualized. Anteromedial, anterolateral, posteromedial, and posterolateral menisci unremarkable without tearing, fraying, effusion, or displacement. Possible mild hypoechoic changes surrounding the medial meniscus but no true displacement noted. Patellar Tendon has a hypoechoic change on the articular side of the patella. No abnormality of prepatellar bursa. Positive pes anserine bursitis noted LCL and MCL unremarkable on long and transverse views. No abnormality of origin of medial or lateral head of the gastrocnemius.  IMPRESSION:  Mild pes anserine bursitis knee seems fairly unremarkable     Impression and Recommendations:     This case required medical decision making of moderate complexity.      Note: This dictation was prepared with Dragon dictation along with smaller phrase technology. Any transcriptional errors that result from this process are unintentional.

## 2016-11-04 NOTE — Assessment & Plan Note (Signed)
Patient is having more of the left knee pain. Seems to be acute in nature. We discussed patient at great length. Discussed icing regimen and home exercises. Patient work with Event organiserathletic trainer. Patient will continue on all other medications. Given trial of topical anti-inflammatories. Discussed with him differential is a meniscal injury but not seen on ultrasound today. Pes anserine bursitis is deathly within the differential. Return to clinic again in 4 weeks for further evaluation.

## 2016-11-30 ENCOUNTER — Ambulatory Visit: Payer: Self-pay

## 2016-11-30 ENCOUNTER — Encounter: Payer: Self-pay | Admitting: Family Medicine

## 2016-11-30 ENCOUNTER — Ambulatory Visit (INDEPENDENT_AMBULATORY_CARE_PROVIDER_SITE_OTHER): Admitting: Family Medicine

## 2016-11-30 VITALS — BP 124/84 | HR 91 | Ht 74.0 in | Wt 266.0 lb

## 2016-11-30 DIAGNOSIS — M7050 Other bursitis of knee, unspecified knee: Secondary | ICD-10-CM

## 2016-11-30 NOTE — Patient Instructions (Signed)
Goo to see you We injected the pes anserine areas today  Continue the compression  Exercises 3 times a week.  pennsaid pinkie amount topically 2 times daily as needed.   See me again ain 4-6 weeks

## 2016-11-30 NOTE — Progress Notes (Signed)
Ronald ScaleZach Ronald Carpenter D.O. [facility] [address] [place], [place] [address] Phone: [phone] Subjective:     CC: left knee pain f/u  OZH:YQMVHQIONGHPI:Subjective  Ronald SheetsJonathan Carpenter is a 36 y.o. male coming in with complaint of Left knee pain. Patient had signs and symptoms that seemed to be more consistent with a meniscal injury but unable to see. Had more of a pes anserine.      Past Medical History:  Diagnosis Date  . Anxiety   . Arthritis    knees, ankles  . Complication of anesthesia    PT  NARCOLEPTIC--  "HARD TO GET TO SLEEP AND HARD TO WAKE"  . Depression   . GERD (gastroesophageal reflux disease)   . 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 Hypertension   . Left inguinal hernia   . Light sensitivity   . Migraine    w/ light sensitivity  -- TCI residual  . Narcolepsy    Past Surgical History:  Procedure Laterality Date  . ANKLE SURGERY Left 2010   removal bone fragment  . CIRCUMCISION  2008  . INGUINAL HERNIA REPAIR Left 02/10/2016   Procedure: LAPAROSCOPIC LEFT INGUINAL HERNIA REPAIR WITH MESH;  Surgeon: De BlanchLuke Aaron Kinsinger, MD;  [facility];  Service: General;  Laterality: Left;  . INSERTION OF MESH Left 02/10/2016   Procedure: INSERTION OF MESH;  Surgeon: Rodman PickleLuke Aaron Kinsinger, MD;  [facility] [place];  Service: General;  Laterality: Left;  . LAPAROSCOPIC CHOLECYSTECTOMY  03/2014  . UVULOPALATOPHARYNGOPLASTY  2009   w/ Tonsillectomy and Adenoidectomy   Social History   Social History  . Marital status: Married    Spouse name: N/A  . Number of children: N/A  . Years of education: N/A   Occupational History  . Geneticist, molecularwarehouse manager     [employer]   Social History Main Topics  . Smoking status: Former Smoker    Years: 10.00    Types: Cigarettes    Quit date: 02/02/2014  . Smokeless tobacco: Current User    Types: Snuff  . Alcohol use No  . Drug use: No  . Sexual  activity: Not Asked   Other Topics Concern  . None   Social History Narrative  . None   No Known Allergies Family History  Problem Relation Age of Onset  . Hypertension Mother   . Stroke Mother   . Hypertension Father     Past medical history, social, surgical and family history all reviewed in electronic medical record.  No pertanent information unless stated regarding to the chief complaint.   Review of Systems: No headache, visual changes, nausea, vomiting, diarrhea, constipation, dizziness, abdominal pain, skin rash, fevers, chills, night sweats, weight loss, swollen lymph nodes, body aches, joint swelling, muscle aches, chest pain, shortness of breath, mood changes.    Objective  Blood pressure 124/84, pulse 91, height 6\' 2"  (1.88 m), weight 266 lb (120.7 kg), SpO2 94 %.  Systems examined below as of 11/30/16   Systems examined below as of 11/30/16 General: NAD A&O x3 mood, affect normal  HEENT: Pupils equal, extraocular movements intact no nystagmus Respiratory: not short of breath at rest or with speaking Cardiovascular: No lower extremity edema, non tender Skin: Warm dry intact with no signs of infection or rash on extremities or on axial skeleton. Abdomen: Soft nontender, no masses Neuro: Cranial nerves  intact, neurovascularly intact in all extremities with  2+ DTRs and 2+ pulses. Lymph: No lymphadenopathy appreciated today  Gait normal with good balance and coordination.  MSK: Non tender with full range of motion and good stability and symmetric strength and tone of shoulders, elbows, wrist, hips and ankles bilaterally.   Knee:left  Normal to inspection with no erythema or effusion or obvious bony abnormalities. Pain more over the pes anserine ROM full in flexion and extension and lower leg rotation. Ligaments with solid consistent endpoints including ACL, PCL, LCL, MCL. Negative Mcmurray's, Apley's, and Thessalonian tests. Non painful patellar  compression. Patellar glide with mild crepitus. Patellar and quadriceps tendons unremarkable. Hamstring and quadriceps strength is normal.  Contralateral knee nontender  Procedure: Real-time Ultrasound Guided Injection of left knee pes anserine bursitis Device: GE Logiq Q7 Ultrasound guided injection is preferred based studies that show increased duration, increased effect, greater accuracy, decreased procedural pain, increased response rate, and decreased cost with ultrasound guided versus blind injection.  Verbal informed consent obtained.  Time-out conducted.  Noted no overlying erythema, induration, or other signs of local infection.  Skin prepped in a sterile fashion.  Local anesthesia: Topical Ethyl chloride.  With sterile technique and under real time ultrasound guidance:  With a 25-gauge 1 inch no patient was injected with a total of 0.5 mL of 0.5% Marcaine and 0.5 mL of Kenalog 40 g/dL. Completed without difficulty  Pain immediately resolved suggesting accurate placement of the medication.  Advised to call if fevers/chills, erythema, induration, drainage, or persistent bleeding.  Images permanently stored and available for review in the ultrasound unit.  Impression: Technically successful ultrasound guided injection.    Impression and Recommendations:     This case required medical decision making of moderate complexity.      Note: This dictation was prepared with Dragon dictation along with smaller phrase technology. Any transcriptional errors that result from this process are unintentional.

## 2016-11-30 NOTE — Assessment & Plan Note (Signed)
No improvement. Patient does have more of a pes bursitis. Discussed with patient at great length. Given injection today. Tolerated the procedure well. Decline physical therapy. Patient will continue all other conservative therapy. Follow-up again in 4 weeks

## 2016-12-28 ENCOUNTER — Encounter: Payer: Self-pay | Admitting: Family Medicine

## 2016-12-28 ENCOUNTER — Ambulatory Visit (INDEPENDENT_AMBULATORY_CARE_PROVIDER_SITE_OTHER): Admitting: Family Medicine

## 2016-12-28 DIAGNOSIS — S83207A Unspecified tear of unspecified meniscus, current injury, left knee, initial encounter: Secondary | ICD-10-CM | POA: Diagnosis not present

## 2016-12-28 NOTE — Patient Instructions (Signed)
Good to see you  Injected the knee Take it easy for 2 days.  Avoid any twisting motion See me again in 4-6 weels

## 2016-12-28 NOTE — Progress Notes (Signed)
Tawana ScaleZach Clair Bardwell D.O. [facility] [address] [place], [place] [address] Phone: [phone] Subjective:     CC: left knee pain f/u  FAO:[id number]:Subjective  Ronald SheetsJonathan Carpenter is a 36 y.o. male coming in with complaint of Left knee pain. Patient had signs and symptoms that seemed to be more consistent with a meniscal injury but unable to see. Had more of a pes anserine.  Patient states that the injection helped only about 10%. Worsening symptoms again. Seems to be coming and going. Some days feels fairly well and in some days has locking.Patient is concern like to increase his activity but is unable to do so secondary to pain.     Past Medical History:  Diagnosis Date  . Anxiety   . Arthritis    knees, ankles  . Complication of anesthesia    PT  NARCOLEPTIC--  "HARD TO GET TO SLEEP AND HARD TO WAKE"  . Depression   . GERD (gastroesophageal reflux disease)   . 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 Hypertension   . Left inguinal hernia   . Light sensitivity   . Migraine    w/ light sensitivity  -- TCI residual  . Narcolepsy    Past Surgical History:  Procedure Laterality Date  . ANKLE SURGERY Left 2010   removal bone fragment  . CIRCUMCISION  2008  . INGUINAL HERNIA REPAIR Left 02/10/2016   Procedure: LAPAROSCOPIC LEFT INGUINAL HERNIA REPAIR WITH MESH;  Surgeon: De BlanchLuke Aaron Kinsinger, MD;  [facility];  Service: General;  Laterality: Left;  . INSERTION OF MESH Left 02/10/2016   Procedure: INSERTION OF MESH;  Surgeon: Rodman PickleLuke Aaron Kinsinger, MD;  [facility];  Service: General;  Laterality: Left;  . LAPAROSCOPIC CHOLECYSTECTOMY  03/2014  . UVULOPALATOPHARYNGOPLASTY  2009   w/ Tonsillectomy and Adenoidectomy   Social History   Social History  . Marital status: Married    Spouse name: N/A  . Number of children: N/A  . Years of education: N/A   Occupational History  . Administrator, artswarehouse  manager     [employer]   Social History Main Topics  . Smoking status: Former Smoker    Years: 10.00    Types: Cigarettes    Quit date: 02/02/2014  . Smokeless tobacco: Current User    Types: Snuff  . Alcohol use No  . Drug use: No  . Sexual activity: Not Asked   Other Topics Concern  . None   Social History Narrative  . None   No Known Allergies Family History  Problem Relation Age of Onset  . Hypertension Mother   . Stroke Mother   . Hypertension Father     Past medical history, social, surgical and family history all reviewed in electronic medical record.  No pertanent information unless stated regarding to the chief complaint.   Review of Systems: No headache, visual changes, nausea, vomiting, diarrhea, constipation, dizziness, abdominal pain, skin rash, fevers, chills, night sweats, weight loss, swollen lymph nodes, body aches, joint swelling, muscle aches, chest pain, shortness of breath, mood changes.     Objective  Blood pressure 124/78, pulse 77, height 6\' 2"  (1.88 m), weight 267 lb (121.1 kg), SpO2 99 %.  Systems examined below as of 12/28/16   Systems examined below as of 12/28/16 General: NAD A&O x3 mood, affect normal  HEENT: Pupils equal, extraocular movements intact no nystagmus Respiratory: not short of  breath at rest or with speaking Cardiovascular: No lower extremity edema, non tender Skin: Warm dry intact with no signs of infection or rash on extremities or on axial skeleton. Abdomen: Soft nontender, no masses Neuro: Cranial nerves  intact, neurovascularly intact in all extremities with 2+ DTRs and 2+ pulses. Lymph: No lymphadenopathy appreciated today  Gait normal with good balance and coordination.  MSK: Non tender with full range of motion and good stability and symmetric strength and tone of shoulders, elbows, wrist,  hips and ankles bilaterally.   Knee:left Normal to inspection with no erythema or effusion or obvious bony  abnormalities. Palpation normal with no warmth, joint line tenderness, patellar tenderness, or condyle tenderness. ROM full in flexion and extension and lower leg rotation. Ligaments with solid consistent endpoints including ACL, PCL, LCL, MCL. positiveMcmurray's, Apley's, and Thessalonian tests. Non painful patellar compression. Patellar glide with mildcrepitus. Patellar and quadriceps tendons unremarkable. Hamstring and quadriceps strength is normal.  Contralateral knee unremarkable   After informed written and verbal consent, patient was seated on exam table. Left knee was prepped with alcohol swab and utilizing anterolateral approach, patient's left knee space was injected with 4:1  marcaine 0.5%: Kenalog 40mg /dL. Patient tolerated the procedure well without immediate complications.   Impression and Recommendations:     This case required medical decision making of moderate complexity.      Note: This dictation was prepared with Dragon dictation along with smaller phrase technology. Any transcriptional errors that result from this process are unintentional.

## 2016-12-28 NOTE — Assessment & Plan Note (Signed)
atient given injection today. Tolerated the procedure well. Hopefully this will be beneficial. We discussed objective is doing which ones to avoid. Encourage patient to increase activity slowly. Patient is artery on multiple different pain medications. No significant improvement advance imaging may be warranted.

## 2016-12-28 NOTE — Progress Notes (Signed)
Pre visit review using our clinic review tool, if applicable. No additional management support is needed unless otherwise documented below in the visit note. 

## 2017-01-19 ENCOUNTER — Other Ambulatory Visit: Payer: Self-pay

## 2017-01-19 ENCOUNTER — Ambulatory Visit (INDEPENDENT_AMBULATORY_CARE_PROVIDER_SITE_OTHER): Admitting: Family Medicine

## 2017-01-19 ENCOUNTER — Encounter: Payer: Self-pay | Admitting: Family Medicine

## 2017-01-19 DIAGNOSIS — S83207A Unspecified tear of unspecified meniscus, current injury, left knee, initial encounter: Secondary | ICD-10-CM | POA: Diagnosis not present

## 2017-01-19 MED ORDER — TIZANIDINE HCL 4 MG PO TABS: 4.0000 mg | ORAL_TABLET | Freq: Three times a day (TID) | ORAL | 0 refills | Status: DC | PRN

## 2017-01-19 NOTE — Progress Notes (Signed)
Tawana ScaleZach Smith D.O. [facility] [address] [place], [place] [address] Phone: [phone] Subjective:     CC: left knee pain f/u  MVH:[id number]:Subjective  Ronald Carpenter is a 36 y.o. male coming in with complaint of Left knee pain.  Patient was seen previously and did have more of a meniscal type injury. Was not responding to the conservative therapy. Given an injection 3 weeks ago. States that he is feeling 70-80% better. Patient is doing relatively well. Once to get back to running. Mild discomfort with mild pain.      Past Medical History:  Diagnosis Date  . Anxiety   . Arthritis    knees, ankles  . Complication of anesthesia    PT  NARCOLEPTIC--  "HARD TO GET TO SLEEP AND HARD TO WAKE"  . Depression   . GERD (gastroesophageal reflux disease)   . 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 Hypertension   . Left inguinal hernia   . Light sensitivity   . Migraine    w/ light sensitivity  -- TCI residual  . Narcolepsy    Past Surgical History:  Procedure Laterality Date  . ANKLE SURGERY Left 2010   removal bone fragment  . CIRCUMCISION  2008  . INGUINAL HERNIA REPAIR Left 02/10/2016   Procedure: LAPAROSCOPIC LEFT INGUINAL HERNIA REPAIR WITH MESH;  Surgeon: De BlanchLuke Aaron Kinsinger, MD;  [facility] [place];  Service: General;  Laterality: Left;  . INSERTION OF MESH Left 02/10/2016   Procedure: INSERTION OF MESH;  Surgeon: Rodman PickleLuke Aaron Kinsinger, MD;  [facility];  Service: General;  Laterality: Left;  . LAPAROSCOPIC CHOLECYSTECTOMY  03/2014  . UVULOPALATOPHARYNGOPLASTY  2009   w/ Tonsillectomy and Adenoidectomy   Social History   Social History  . Marital status: Married    Spouse name: N/A  . Number of children: N/A  . Years of education: N/A   Occupational History  . Geneticist, molecularwarehouse manager     [employer]   Social History Main Topics  . Smoking status: Former Smoker   Years: 10.00    Types: Cigarettes    Quit date: 02/02/2014  . Smokeless tobacco: Current User    Types: Snuff  . Alcohol use No  . Drug use: No  . Sexual activity: Not Asked   Other Topics Concern  . None   Social History Narrative  . None   No Known Allergies Family History  Problem Relation Age of Onset  . Hypertension Mother   . Stroke Mother   . Hypertension Father     Past medical history, social, surgical and family history all reviewed in electronic medical record.  No pertanent information unless stated regarding to the chief complaint.   Review of Systems: No headache, visual changes, nausea, vomiting, diarrhea, constipation, dizziness, abdominal pain, skin rash, fevers, chills, night sweats, weight loss, swollen lymph nodes, body aches, joint swelling,  chest pain, shortness of breath, mood changes.  Positive muscle aches   Objective  Blood pressure 130/84, pulse 84, height 6\' 2"  (1.88 m), weight 266 lb (120.7 kg), SpO2 97 %.   Systems examined below as of 01/19/17 General: NAD A&O x3 mood, affect normal  HEENT: Pupils equal, extraocular movements intact no nystagmus Respiratory: not short of breath at rest or with speaking Cardiovascular: No lower extremity edema, non tender Skin: Warm dry intact with no signs of infection or rash on extremities or on  axial skeleton. Abdomen: Soft nontender, no masses Neuro: Cranial nerves  intact, neurovascularly intact in all extremities with 2+ DTRs and 2+ pulses. Lymph: No lymphadenopathy appreciated today  Gait normal with good balance and coordination.  MSK: Non tender with full range of motion and good stability and symmetric strength and tone of shoulders, elbows, wrist, hips and ankles bilaterally.   Knee:Left Normal to inspection with no erythema or effusion or obvious bony abnormalities. Mild discomfort over the medial knee.  ROM full in flexion and extension and lower leg rotation. Ligaments with solid consistent  endpoints including ACL, PCL, LCL, MCL. Mild positive Mcmurray's, Apley's, and Thessalonian tests. Mild painful patellar compression. Patellar glide without crepitus. Patellar and quadriceps tendons unremarkable. Hamstring and quadriceps strength is normal.  Contralateral knee unremarkable      Impression and Recommendations:     This case required medical decision making of moderate complexity.      Note: This dictation was prepared with Dragon dictation along with smaller phrase technology. Any transcriptional errors that result from this process are unintentional.

## 2017-01-19 NOTE — Assessment & Plan Note (Signed)
Improved after injection. Encourage patient to continue the home exercises in the icing regimen. Patient will continue with the home exercises and given a running progression I think will be beneficial. Patient will be out of the state for the next 2 months. We will see how patient does. Follow-up plan.

## 2017-01-19 NOTE — Patient Instructions (Addendum)
Good to see you  Gustavus Bryantce is your friend.  Keep active OK to bike, elliptical and swim See me again when you return at end of September and we can discuss your neck more Zanaflex up to 3 times a day if needed.   Starting in 2 weeks Start a walk-run progression: only 2-3 times a week.  - Initially start one minute walking than one minute running for 20 mins in the first week,   then 25 mins during the second week, then 30 mins afterwards.  Once you have reached 30 mins: - Run 2 mins, then walk 1 min. -Then run 3 mins, and walk 1 min. -Then run 4 mins, and walk 1 min. -Then run 5 mins, and walk 1 min. -Slowly build up weekly to running 30 mins nonstop.  If painful at any of the steps, back up one step. See you again in 6-8 weeks!

## 2017-03-11 ENCOUNTER — Encounter: Payer: Self-pay | Admitting: Family Medicine

## 2017-03-11 ENCOUNTER — Ambulatory Visit (INDEPENDENT_AMBULATORY_CARE_PROVIDER_SITE_OTHER): Admitting: Family Medicine

## 2017-03-11 DIAGNOSIS — G2589 Other specified extrapyramidal and movement disorders: Secondary | ICD-10-CM

## 2017-03-11 DIAGNOSIS — M999 Biomechanical lesion, unspecified: Secondary | ICD-10-CM | POA: Diagnosis not present

## 2017-03-11 MED ORDER — DULOXETINE HCL 20 MG PO CPEP: 20.0000 mg | ORAL_CAPSULE | Freq: Every day | ORAL | 3 refills | Status: DC

## 2017-03-11 NOTE — Assessment & Plan Note (Signed)
Patient is in poor core strength as well as some poor posture. We discussed scapular dyskinesis out of think will be beneficial for patient. Attempted osteopathic manipulation with some improvement. Hopefully that this will continue to improve. I do believe that this helped very very slowly. Patient will come back and see me again in 3 weeks. Did make one change with Cymbalta.

## 2017-03-11 NOTE — Patient Instructions (Signed)
Good to see you  Ronald Carpenter is your friend.  Exercises 3 times a week.  Cymbalta 20 mg daily  Decrease sertaline to 50 mg daily  Tart cherry extract any dose at night  See me again In 3-4 weeks.

## 2017-03-11 NOTE — Assessment & Plan Note (Signed)
Decision today to treat with OMT was based on Physical Exam  After verbal consent patient was treated with HVLA, ME, FPR techniques in cervical, thoracic, lumbar and sacral areas  Patient tolerated the procedure well with improvement in symptoms  Patient given exercises, stretches and lifestyle modifications  See medications in patient instructions if given  Patient will follow up in 3-4 weeks  

## 2017-03-11 NOTE — Progress Notes (Addendum)
[name] [address] [place], [place] [address] Phone: [phone] Subjective:     CC: Back and neck pain  BJY:[id number]  Ronald Carpenter is a 36 y.o. male coming in with complaint of back and neck pain. Patient states he was an inpatient for PTSD. Patient is now going to be more active. Did gain a significant amount weight while he was in there. Patient like to know what else he can potentially do. Patient is going to start increasing activity as tolerated. We discussed icing regimen and home exercises. Patient has not done any of them at this time. Patient states that it is a soreness mostly in the mid back and seems to be the neck. No significant radiation down to the extremities. Patient rates the severity of pain a 7 out of 10. Can be debilitating.      Past Medical History:  Diagnosis Date  . Anxiety   . Arthritis    knees, ankles  . Complication of anesthesia    PT  NARCOLEPTIC--  "HARD TO GET TO SLEEP AND HARD TO WAKE"  . Depression   . GERD (gastroesophageal reflux disease)   . 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 Hypertension   . Left inguinal hernia   . Light sensitivity   . Migraine    w/ light sensitivity  -- TCI residual  . Narcolepsy    Past Surgical History:  Procedure Laterality Date  . ANKLE SURGERY Left 2010   removal bone fragment  . CIRCUMCISION  2008  . INGUINAL HERNIA REPAIR Left 02/10/2016   Procedure: LAPAROSCOPIC LEFT INGUINAL HERNIA REPAIR WITH MESH;  Surgeon: De Blanch Kinsinger, MD;  [facility]: [facility];  Service: General;  Laterality: Left;  . INSERTION OF MESH Left 02/10/2016   Procedure: INSERTION OF MESH;  Surgeon: Rodman Pickle, MD;  Location: [facility];  Service: General;  Laterality: Left;  . LAPAROSCOPIC CHOLECYSTECTOMY  03/2014  . UVULOPALATOPHARYNGOPLASTY  2009   w/ Tonsillectomy and Adenoidectomy   Social  History   Social History  . Marital status: Married    Spouse name: N/A  . Number of children: N/A  . Years of education: N/A   Occupational History  . 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 Smoking status: Former Smoker    Years: 10.00    Types: Cigarettes    Quit date: 02/02/2014  . Smokeless tobacco: Current User    Types: Snuff  . Alcohol use No  . Drug use: No  . Sexual activity: Not Asked   Other Topics Concern  . None   Social History Narrative  . None   No Known Allergies Family History  Problem Relation Age of Onset  . Hypertension Mother   . Stroke Mother   . Hypertension Father      Past medical history, social, surgical and family history all reviewed in electronic medical record.  No pertanent information unless stated regarding to the chief complaint.   Review of Systems:Review of systems updated and as accurate as of 03/11/17  No headache, visual changes, nausea, vomiting, diarrhea, constipation, dizziness, abdominal pain, skin rash, fevers, chills, night sweats, weight loss, swollen lymph nodes, body aches, joint swelling, muscle aches, chest pain, shortness of breath, mood changes.   Objective  Blood pressure 120/70, pulse 81, height  (1.88 m), weight  270 lb (122.5 kg), SpO2 96 %. Systems examined below as of 03/11/17   General: No apparent distress alert and oriented x3 mood and affect normal, dressed appropriately.  HEENT: Pupils equal, extraocular movements intact  Respiratory: Patient's speak in full sentences and does not appear short of breath  Cardiovascular: No lower extremity edema, non tender, no erythema  Skin: Warm dry intact with no signs of infection or rash on extremities or on axial skeleton.  Abdomen: Soft nontender  Neuro: Cranial nerves II through XII are intact, neurovascularly intact in all extremities with 2+ DTRs and 2+ pulses.  Lymph: No lymphadenopathy of posterior or anterior  cervical chain or axillae bilaterally.  Gait normal with good balance and coordination.  MSK:  Non tender with full range of motion and good stability and symmetric strength and tone of shoulders, elbows, wrist, hip, knee and ankles bilaterally.  Neck: Inspection unremarkable. No palpable stepoffs. Negative Spurling's maneuver. Patient is lacking the last 10 of side bending bilaterally Grip strength and sensation normal in bilateral hands Strength good C4 to T1 distribution No sensory change to C4 to T1 Negative Hoffman sign bilaterally Reflexes normal Scapular dyskinesis noted bilaterally with mild increasing kyphosis Severely poor core strength.  Osteopathic findings C4 flexed rotated and side bent right  T3 extended rotated and side bent left  T9 extended rotated and side bent left L2 flexed rotated and side bent right Sacrum right on right     Impression and Recommendations:     This case required medical decision making of moderate complexity.      Note: This dictation was prepared with Dragon dictation along with smaller phrase technology. Any transcriptional errors that result from this process are unintentional.

## 2017-03-11 NOTE — Addendum Note (Signed)
Addended by: Judi Saa on: 03/11/2017 09:20 AM   Modules accepted: Orders

## 2017-04-01 ENCOUNTER — Ambulatory Visit: Admitting: Family Medicine

## 2017-04-09 ENCOUNTER — Ambulatory Visit (INDEPENDENT_AMBULATORY_CARE_PROVIDER_SITE_OTHER)
Admission: RE | Admit: 2017-04-09 | Discharge: 2017-04-09 | Disposition: A | Discharge status: Home / Self Care | Source: Ambulatory Visit | Attending: Family Medicine | Admitting: Family Medicine

## 2017-04-09 ENCOUNTER — Encounter: Payer: Self-pay | Admitting: Family Medicine

## 2017-04-09 ENCOUNTER — Ambulatory Visit (INDEPENDENT_AMBULATORY_CARE_PROVIDER_SITE_OTHER): Admitting: Family Medicine

## 2017-04-09 VITALS — BP 110/82 | HR 82 | Ht 74.0 in | Wt 263.0 lb

## 2017-04-09 DIAGNOSIS — M25561 Pain in right knee: Secondary | ICD-10-CM

## 2017-04-09 DIAGNOSIS — M999 Biomechanical lesion, unspecified: Secondary | ICD-10-CM | POA: Diagnosis not present

## 2017-04-09 DIAGNOSIS — G8929 Other chronic pain: Secondary | ICD-10-CM | POA: Diagnosis not present

## 2017-04-09 DIAGNOSIS — G2589 Other specified extrapyramidal and movement disorders: Secondary | ICD-10-CM | POA: Diagnosis not present

## 2017-04-09 NOTE — Patient Instructions (Signed)
Good to see you  Injected the knee again.  Ice 20 minutes 2 times daily. Usually after activity and before bed. pennsaid pinkie amount topically 2 times daily as needed.   Stay active  Continue cymbalta  Xray downstairs See me again in 4-6 weeks.

## 2017-04-09 NOTE — Progress Notes (Signed)
[name] [address] [place], [place] [address] Phone: [phone] Subjective:     CC: Chronic pain follow-up  [id number]  Ronald Carpenter is a 36 y.o. male coming in with complaint of  Chronic pain. Patient does have PTSD. Patient was started on Cymbalta and restarted osteopathic manipulation. Patient states Back pain seems to have good days and bad days.  Patient was also found to have meniscal tear of the knee and seemed to be doing relatively well. Last injections July 2018.Patient is having worsening pain on the right knee. Sent have some instability. Patient states sometimes it seems to be locking. Keep some from daily activities such as working out. Patient is motivated to lose weight but is wondering what he can potentially do.     Past Medical History:  Diagnosis Date  . Anxiety   . Arthritis    knees, ankles  . Complication of anesthesia    PT  NARCOLEPTIC--  "HARD TO GET TO SLEEP AND HARD TO WAKE"  . Depression   . GERD (gastroesophageal reflux disease)   . 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 Hypertension   . Left inguinal hernia   . Light sensitivity   . Migraine    w/ light sensitivity  -- TCI residual  . Narcolepsy    Past Surgical History:  Procedure Laterality Date  . ANKLE SURGERY Left 2010   removal bone fragment  . CIRCUMCISION  2008  . INGUINAL HERNIA REPAIR Left 02/10/2016   Procedure: LAPAROSCOPIC LEFT INGUINAL HERNIA REPAIR WITH MESH;  Surgeon: De Blanch Kinsinger, MD;  Location: [facility];  Service: General;  Laterality: Left;  . INSERTION OF MESH Left 02/10/2016   Procedure: INSERTION OF MESH;  Surgeon: Rodman Pickle, MD;  Location: [facility];  Service: General;  Laterality: Left;  . LAPAROSCOPIC CHOLECYSTECTOMY  03/2014  . UVULOPALATOPHARYNGOPLASTY  2009   w/ Tonsillectomy and Adenoidectomy   Social History    Social History  . Marital status: Married    Spouse name: N/A  . Number of children: N/A  . Years of education: N/A   Occupational History  . 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 Smoking status: Former Smoker    Years: 10.00    Types: Cigarettes    Quit date: 02/02/2014  . Smokeless tobacco: Current User    Types: Snuff  . Alcohol use No  . Drug use: No  . Sexual activity: Not Asked   Other Topics Concern  . None   Social History Narrative  . None   No Known Allergies Family History  Problem Relation Age of Onset  . Hypertension Mother   . Stroke Mother   . Hypertension Father      Past medical history, social, surgical and family history all reviewed in electronic medical record.  No pertanent information unless stated regarding to the chief complaint.   Review of Systems:Review of systems updated and as accurate as of 04/09/17  No visual changes, nausea, vomiting, diarrhea, constipation, dizziness, abdominal pain, skin rash, fevers, chills, night sweats, weight loss, swollen lymph nodes,  chest pain, shortness of breath, mood changes. Positive muscle aches, headaches, body aches  Objective  Blood pressure 110/82, pulse 82, height 6\' 2"  (1.88 m), weight 263 lb (119.3 kg), SpO2 96 %. Systems examined below as of 04/09/17  General: No apparent distress alert and oriented x3 mood and affect normal, dressed appropriately.  HEENT: Pupils equal, extraocular movements intact  Respiratory: Patient's speak in full sentences and does not appear short of breath  Cardiovascular: No lower extremity edema, non tender, no erythema  Skin: Warm dry intact with no signs of infection or rash on extremities or on axial skeleton.  Abdomen: Soft nontender  Neuro: Cranial nerves II through XII are intact, neurovascularly intact in all extremities with 2+ DTRs and 2+ pulses.  Lymph: No lymphadenopathy of posterior or anterior cervical chain or  axillae bilaterally.  Gait normal with good balance and coordination.  MSK:  Non tender with full range of motion and good stability and symmetric strength and tone of shoulders, elbows, wrist, hip, and ankles bilaterally.  Knee: Right Normal to inspection with no erythema or effusion or obvious bony abnormalities. Palpation normal with no warmth, joint line tenderness, patellar tenderness, or condyle tenderness. ROM full in flexion and extension and lower leg rotation. Ligaments with solid consistent endpoints including ACL, PCL, LCL, MCL. Positive Mcmurray's, Apley's, and Thessalonian tests. Mild painful patellar compression. Patellar glide with moderate crepitus. Patellar and quadriceps tendons unremarkable. Hamstring and quadriceps strength is normal.  Contralateral knee unremarkable Back exam shows the patient has near full range of motion with flexion but does have limitation and extension by 5-10, as well as rotation bilaterally. Mild positive Faber bilaterally. Negative straight leg test. Tender to palpation in the paraspinal musculature lumbar spine.  Osteopathic findings C2 flexed rotated and side bent right C7 flexed rotated and side bent left T3 extended rotated and side bent right inhaled third rib T11 extended rotated and side bent left L1 flexed rotated and side bent right Sacrum right on right     Impression and Recommendations:     This case required medical decision making of moderate complexity.      Note: This dictation was prepared with Dragon dictation along with smaller phrase technology. Any transcriptional errors that result from this process are unintentional.

## 2017-04-09 NOTE — Assessment & Plan Note (Signed)
Patient given injection in the right knee. We have not done previously. Patient hopefully will respond well to conservative therapy. X-rays ordered today due to patient having some internal derangement type symptoms. We discussed icing regimen. Patient will come back in 4 weeks. Worsening symptoms could need advance imaging.

## 2017-04-09 NOTE — Assessment & Plan Note (Signed)
Continues to have difficulty with posture and ergonomics. Still has poor core strength. Encourage patient to do the exercises on a more regular basis. We discussed icing regimen. We discussed home exercises. Patient come back and see me again in 4-6 weeks.

## 2017-04-09 NOTE — Assessment & Plan Note (Signed)
Decision today to treat with OMT was based on Physical Exam  After verbal consent patient was treated with HVLA, ME, FPR techniques in cervical, thoracic, lumbar and sacral areas  Patient tolerated the procedure well with improvement in symptoms  Patient given exercises, stretches and lifestyle modifications  See medications in patient instructions if given  Patient will follow up in 4-6 weeks 

## 2017-04-10 ENCOUNTER — Encounter (HOSPITAL_COMMUNITY): Payer: Self-pay

## 2017-04-10 ENCOUNTER — Emergency Department (HOSPITAL_COMMUNITY)
Admission: EM | Admit: 2017-04-10 | Discharge: 2017-04-10 | Disposition: A | Discharge status: Home / Self Care | Attending: Emergency Medicine | Admitting: Emergency Medicine

## 2017-04-10 DIAGNOSIS — L600 Ingrowing nail: Secondary | ICD-10-CM | POA: Insufficient documentation

## 2017-04-10 DIAGNOSIS — F17210 Nicotine dependence, cigarettes, uncomplicated: Secondary | ICD-10-CM | POA: Diagnosis not present

## 2017-04-10 DIAGNOSIS — H00015 Hordeolum externum left lower eyelid: Secondary | ICD-10-CM | POA: Diagnosis not present

## 2017-04-10 DIAGNOSIS — I10 Essential (primary) hypertension: Secondary | ICD-10-CM | POA: Insufficient documentation

## 2017-04-10 DIAGNOSIS — M79674 Pain in right toe(s): Secondary | ICD-10-CM | POA: Diagnosis present

## 2017-04-10 DIAGNOSIS — Z79899 Other long term (current) drug therapy: Secondary | ICD-10-CM | POA: Insufficient documentation

## 2017-04-10 MED ORDER — BACITRACIN ZINC 500 UNIT/GM EX OINT
TOPICAL_OINTMENT | CUTANEOUS | Status: AC
  Filled 2017-04-10: qty 1.8

## 2017-04-10 MED ORDER — LIDOCAINE-EPINEPHRINE (PF) 2 %-1:200000 IJ SOLN
10.0000 mL | Freq: Once | INTRAMUSCULAR | Status: AC
  Administered 2017-04-10: 10 mL
  Filled 2017-04-10: qty 20

## 2017-04-10 MED ORDER — CHLORHEXIDINE GLUCONATE 4 % EX LIQD: Freq: Every day | CUTANEOUS | 0 refills | Status: DC | PRN

## 2017-04-10 MED ORDER — DOXYCYCLINE HYCLATE 100 MG PO CAPS: 100.0000 mg | ORAL_CAPSULE | Freq: Two times a day (BID) | ORAL | 0 refills | Status: DC

## 2017-04-10 MED ORDER — ERYTHROMYCIN 5 MG/GM OP OINT: TOPICAL_OINTMENT | OPHTHALMIC | 0 refills | Status: DC

## 2017-04-10 NOTE — ED Triage Notes (Signed)
Pt complains of a right infected big toe and what appears to be a stye on his left eye

## 2017-04-10 NOTE — ED Provider Notes (Signed)
[facility]-EMERGENCY DEPT Provider Note   CSN: [id number] Arrival date & time: 04/10/17  0046     History   Chief Complaint Chief Complaint  Patient presents with  . Toe Pain  . Eye Pain    HPI Ronald Carpenter is a 36 y.o. male.  Patient presents to the emergency department with right great toe pain.  He states that the symptoms started several days ago.  He reports pain with palpation and movement.  He reports some associated purulent discharge from the site of his toe near the toenail.  He denies any injuries. Patient also complains of a stye to his left lower eyelid.  He denies any other associated symptoms.  He denies any modifying factors.  He has not tried anything to alleviate his symptoms.   The history is provided by the patient. No language interpreter was used.    Past Medical History:  Diagnosis Date  . Anxiety   . Arthritis    knees, ankles  . Complication of anesthesia    PT  NARCOLEPTIC--  "HARD TO GET TO SLEEP AND HARD TO WAKE"  . Depression   . GERD (gastroesophageal reflux disease)   . 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 Hypertension   . Left inguinal hernia   . Light sensitivity   . Migraine    w/ light sensitivity  -- TCI residual  . Narcolepsy     Patient Active Problem List   Diagnosis Date Noted  . Chronic pain of right knee 04/09/2017  . Scapular dyskinesis 03/11/2017  . Nonallopathic lesion of cervical region 03/11/2017  . Nonallopathic lesion of thoracic region 03/11/2017  . Nonallopathic lesion of lumbosacral region 03/11/2017  . Tear of left meniscus as current injury 12/28/2016  . Pes anserine bursitis 11/30/2016  . Left knee pain 11/04/2016  . Left inguinal hernia 01/22/2016  . Hypertension 01/06/2016  . Narcolepsy 01/06/2016  . Anxiety 01/06/2016  . Depression 01/06/2016    Past Surgical History:  Procedure Laterality Date  . ANKLE SURGERY Left 2010     removal bone fragment  . CIRCUMCISION  2008  . INGUINAL HERNIA REPAIR Left 02/10/2016   Procedure: LAPAROSCOPIC LEFT INGUINAL HERNIA REPAIR WITH MESH;  Surgeon: De Blanch Kinsinger, MD;  Location: [facility];  Service: General;  Laterality: Left;  . INSERTION OF MESH Left 02/10/2016   Procedure: INSERTION OF MESH;  Surgeon: Rodman Pickle, MD;  Location: [facility];  Service: General;  Laterality: Left;  . LAPAROSCOPIC CHOLECYSTECTOMY  03/2014  . UVULOPALATOPHARYNGOPLASTY  2009   w/ Tonsillectomy and Adenoidectomy       Home Medications    Prior to Admission medications   Medication Sig Start Date End Date Taking? Authorizing Provider  DULoxetine (CYMBALTA) 20 MG capsule Take 1 capsule (20 mg total) by mouth daily. 03/11/17   Judi Saa, DO  HYDROcodone-acetaminophen (NORCO/VICODIN) 5-325 MG tablet Take 1-2 tablets by mouth every 6 (six) hours as needed for moderate pain or severe pain. 10/28/16   Antony Madura, PA-C  ibuprofen (ADVIL,MOTRIN) 800 MG tablet Take 1 tablet (800 mg total) by mouth every 8 (eight) hours as needed. 02/10/16   Kinsinger, De Blanch, MD  methylphenidate (RITALIN LA) 30 MG 24 hr capsule Take 30 mg by mouth 2 (two) times daily. AM UPON WAKING  AND AT LUNCH TIME    [provider]  methylphenidate (RITALIN) 5 MG tablet  Take 5 mg by mouth 3 (three) times daily as needed (For narcolepsy.).     [provider]  OLANZapine (ZYPREXA) 5 MG tablet Take 5 mg by mouth at bedtime.    [provider]  Oxcarbazepine (TRILEPTAL) 300 MG tablet Take 450 mg by mouth 2 (two) times daily.    [provider]  prazosin (MINIPRESS) 2 MG capsule Take 2 mg by mouth at bedtime.    [provider]  propranolol (INDERAL) 10 MG tablet Take 10 mg by mouth 3 (three) times daily.     [provider]  sertraline (ZOLOFT) 100 MG tablet Take 100 mg by mouth daily.    [provider]   telmisartan-hydrochlorothiazide (MICARDIS HCT) 40-12.5 MG per tablet Take 1 tablet by mouth every morning.    [provider]  tiZANidine (ZANAFLEX) 4 MG tablet Take 1 tablet (4 mg total) by mouth every 8 (eight) hours as needed for muscle spasms. 01/19/17   Judi Saa, DO  traMADol (ULTRAM) 50 MG tablet Take 1 tablet (50 mg total) by mouth every 8 (eight) hours as needed. 01/08/16   Valarie Cones, Dema Severin, PA-C    Family History Family History  Problem Relation Age of Onset  . Hypertension Mother   . Stroke Mother   . Hypertension Father     Social History Social History  Substance Use Topics  . Smoking status: Former Smoker    Years: 10.00    Types: Cigarettes    Quit date: 02/02/2014  . Smokeless tobacco: Current User    Types: Snuff  . Alcohol use No     Allergies   Patient has no known allergies.   Review of Systems Review of Systems  All other systems reviewed and are negative.    Physical Exam Updated Vital Signs BP (!) 140/93 (BP Location: Right Arm)   Pulse 86   Temp 98.3 F (36.8 C) (Oral)   Resp 20   SpO2 97%   Physical Exam  Constitutional: He is oriented to person, place, and time. He appears well-developed and well-nourished.  HENT:  Head: Normocephalic and atraumatic.  Left lower lid stye  Eyes: Conjunctivae and EOM are normal.  Neck: Normal range of motion.  Cardiovascular: Normal rate.   Pulmonary/Chest: Effort normal.  Abdominal: He exhibits no distension.  Musculoskeletal: Normal range of motion.  Neurological: He is alert and oriented to person, place, and time.  Skin: Skin is dry.  Right great toe ingrown toenail  Psychiatric: He has a normal mood and affect. His behavior is normal. Judgment and thought content normal.  Nursing note and vitals reviewed.    ED Treatments / Results  Labs (all labs ordered are listed, but only abnormal results are displayed) Labs Reviewed - No data to display  EKG  EKG Interpretation None        Radiology Dg Knee Complete 4 Views Right  Result Date: 04/09/2017 CLINICAL DATA:  Chronic right knee pain and swelling. No recent injury. EXAM: RIGHT KNEE - COMPLETE 4+ VIEW COMPARISON:  None. FINDINGS: No evidence of fracture, dislocation, or joint effusion. No evidence of arthropathy or other focal bone abnormality. Soft tissues are unremarkable. IMPRESSION: Negative. Electronically Signed   By: Elige Ko   On: 04/09/2017 09:01    Procedures .Nail Removal Date/Time: 04/10/2017 6:15 AM Performed by: Roxy Horseman Authorized by: Roxy Horseman   Consent:    Consent obtained:  Verbal   Consent given by:  Patient   Risks discussed:  Pain   Alternatives discussed:  No treatment Location:    Foot:  R big toe Pre-procedure details:    Skin preparation:  Betadine   Preparation: Patient was prepped and draped in the usual sterile fashion   Anesthesia (see MAR for exact dosages):    Anesthesia method:  Local infiltration   Local anesthetic:  Lidocaine 1% WITH epi Nail Removal:    Nail removed:  Partial   Nail side:  Medial Trephination:    Subungual hematoma drained: no   Post-procedure details:    Dressing:  Antibiotic ointment   Patient tolerance of procedure:  Tolerated well, no immediate complications   Brisk cap refill post procedure.  Medications Ordered in ED Medications  lidocaine-EPINEPHrine (XYLOCAINE W/EPI) 2 %-1:200000 (PF) injection 10 mL (not administered)     Initial Impression / Assessment and Plan / ED Course  I have reviewed the triage vital signs and the nursing notes.  Pertinent labs & imaging results that were available during my care of the patient were reviewed by me and considered in my medical decision making (see chart for details).     Patient with ingrown toenail.  Removed ingrown portion.  romycin for stye.  Follow-up with PCP.   Final Clinical Impressions(s) / ED Diagnoses   Final diagnoses:  Ingrown toenail  Hordeolum  externum of left lower eyelid    New Prescriptions New Prescriptions   CHLORHEXIDINE (HIBICLENS) 4 % EXTERNAL LIQUID    Apply topically daily as needed. Pour a table spoon into a basin of warm water and soak your foot for 15 minutes for the next week.   DOXYCYCLINE (VIBRAMYCIN) 100 MG CAPSULE    Take 1 capsule (100 mg total) by mouth 2 (two) times daily.   ERYTHROMYCIN OPHTHALMIC OINTMENT    Place a 1/2 inch ribbon of ointment into the lower eyelid.     Roxy HorsemanBrowning, Osborn Pullin, PA-C 04/10/17 0617    Roxy HorsemanBrowning, Mal Asher, PA-C 04/10/17 [phone]    Paula LibraMolpus, John, MD 04/10/17 [phone]

## 2017-05-08 NOTE — Progress Notes (Signed)
Tawana ScaleZach Smith D.O. [facility] [address] [place], [place] [address] Phone: [phone](336) [phone] Subjective:     CC: Right knee pain follow-up, chronic back pain  BJY:[id number]:Subjective  Ronald SheetsJonathan Carpenter is a 36 y.o. male coming in with complaint of  Knee pain.  Found to have more of a meniscal injury.  Was last injected April 09, 2017.  Patient states that he has been increasing instability.  Patient has been using a cane to walk.  Patient states that that unfortunately become more difficult with even regular daily activities.  Going up and down stairs can be even difficult.  She has also had chronic back pain.  Seen patient multiple times.  Changed to Cymbalta.  Intermittent response to osteopathic manipulation.  Patient states that his back is hurting him quite a bit. He is using his cane more frequently due to his pain. Continues to deny radiating pain into the lower extremity.      Past Medical History:  Diagnosis Date  . Anxiety   . Arthritis    knees, ankles  . Complication of anesthesia    PT  NARCOLEPTIC--  "HARD TO GET TO SLEEP AND HARD TO WAKE"  . Depression   . GERD (gastroesophageal reflux disease)   . 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 Hypertension   . Left inguinal hernia   . Light sensitivity   . Migraine    w/ light sensitivity  -- TCI residual  . Narcolepsy    Past Surgical History:  Procedure Laterality Date  . ANKLE SURGERY Left 2010   removal bone fragment  . CIRCUMCISION  2008  . INGUINAL HERNIA REPAIR Left 02/10/2016   Procedure: LAPAROSCOPIC LEFT INGUINAL HERNIA REPAIR WITH MESH;  Surgeon: De BlanchLuke Aaron Kinsinger, MD;  [facility];  Service: General;  Laterality: Left;  . INSERTION OF MESH Left 02/10/2016   Procedure: INSERTION OF MESH;  Surgeon: Rodman PickleLuke Aaron Kinsinger, MD;  [facility] [place] [facility];  Service: General;  Laterality: Left;  . LAPAROSCOPIC CHOLECYSTECTOMY   03/2014  . UVULOPALATOPHARYNGOPLASTY  2009   w/ Tonsillectomy and Adenoidectomy   Social History   Socioeconomic History  . Marital status: Married    Spouse name: None  . Number of children: None  . Years of education: None  . Highest education level: None  Social Needs  . Financial resource strain: None  . Food insecurity - worry: None  . Food insecurity - inability: None  . Transportation needs - medical: None  . Transportation needs - non-medical: None  Occupational History  . Occupation: Designer, industrial/productwarehouse manager    Comment: Engineer, [employer]  Tobacco Use  . Smoking status: Former Smoker    Years: 10.00    Types: Cigarettes    Last attempt to quit: 02/02/2014    Years since quitting: 3.2  . Smokeless tobacco: Current User    Types: Snuff  Substance and Sexual Activity  . Alcohol use: No  . Drug use: No  . Sexual activity: None  Other Topics Concern  . None  Social History Narrative  . None   No Known Allergies Family History  Problem Relation Age of Onset  . Hypertension Mother   . Stroke Mother   . Hypertension Father      Past medical history, social, surgical and family history all reviewed in electronic medical record.  No pertanent information unless stated regarding to the chief complaint.  Review of Systems:Review of systems updated and as accurate as of 05/10/17  No headache, visual changes, nausea, vomiting, diarrhea, constipation, dizziness, abdominal pain, skin rash, fevers, chills, night sweats, weight loss, swollen lymph nodes, body aches, joint swelling,  chest pain, shortness of breath, mood changes.  Positive muscle aches  Objective  Blood pressure 122/84, pulse 78, height 6\' 2"  (1.88 m), weight 261 lb (118.4 kg), SpO2 96 %. Systems examined below as of 05/10/17   General: No apparent distress alert and oriented x3 mood and affect normal, dressed appropriately.  HEENT: Pupils equal, extraocular movements intact  Respiratory: Patient's speak in  full sentences and does not appear short of breath  Cardiovascular: No lower extremity edema, non tender, no erythema  Skin: Warm dry intact with no signs of infection or rash on extremities or on axial skeleton.  Abdomen: Soft nontender  Neuro: Cranial nerves II through XII are intact, neurovascularly intact in all extremities with 2+ DTRs and 2+ pulses.  Lymph: No lymphadenopathy of posterior or anterior cervical chain or axillae bilaterally.  Gait normal with good balance and coordination.  MSK:  Non tender with full range of motion and good stability and symmetric strength and tone of shoulders, elbows, wrist, hip, and ankles bilaterally.  Knee: Right Normal to inspection with no erythema or effusion or obvious bony abnormalities. Palpation normal with no warmth, joint line tenderness, patellar tenderness, or condyle tenderness. ROM full in flexion and extension and lower leg rotation. Ligaments with solid consistent endpoints including ACL, PCL, LCL, MCL. Positive Mcmurray's, Apley's, and Thessalonian tests. Mild painful patellar compression. Patellar glide with mild crepitus. Patellar and quadriceps tendons unremarkable. Hamstring and quadriceps strength is normal. Contralateral knee unremarkable  Back Exam:  Inspection: Loss of lordosis Motion: Flexion 45 deg, Extension 15 deg, Side Bending to 45 deg bilaterally,  Rotation to 45 deg bilaterally  SLR laying: Negative  XSLR laying: Negative  Palpable tenderness: Tender over the paraspinal musculature of the thoracolumbar juncture right greater than left mild over the right sacroiliac joint. FABER: Positive right. Sensory change: Gross sensation intact to all lumbar and sacral dermatomes.  Reflexes: 2+ at both patellar tendons, 2+ at achilles tendons, Babinski's downgoing.  Strength at foot  Plantar-flexion: 5/5 Dorsi-flexion: 5/5 Eversion: 5/5 Inversion: 5/5  Leg strength  Quad: 5/5 Hamstring: 5/5 Hip flexor: 5/5 Hip abductors:  5/5  Gait unremarkable.  Osteopathic findings C2 flexed rotated and side bent right C4 flexed rotated and side bent left C7 flexed rotated and side bent left T3 extended rotated and side bent right inhaled third rib T7 extended rotated and side bent left L1 flexed rotated and side bent right Sacrum right on right    Impression and Recommendations:     This case required medical decision making of moderate complexity.      Note: This dictation was prepared with Dragon dictation along with smaller phrase technology. Any transcriptional errors that result from this process are unintentional.

## 2017-05-10 ENCOUNTER — Ambulatory Visit (INDEPENDENT_AMBULATORY_CARE_PROVIDER_SITE_OTHER): Admitting: Family Medicine

## 2017-05-10 ENCOUNTER — Encounter: Payer: Self-pay | Admitting: Family Medicine

## 2017-05-10 VITALS — BP 122/84 | HR 78 | Ht 74.0 in | Wt 261.0 lb

## 2017-05-10 DIAGNOSIS — G8929 Other chronic pain: Secondary | ICD-10-CM | POA: Diagnosis not present

## 2017-05-10 DIAGNOSIS — M999 Biomechanical lesion, unspecified: Secondary | ICD-10-CM | POA: Diagnosis not present

## 2017-05-10 DIAGNOSIS — G2589 Other specified extrapyramidal and movement disorders: Secondary | ICD-10-CM

## 2017-05-10 DIAGNOSIS — M25561 Pain in right knee: Secondary | ICD-10-CM | POA: Diagnosis not present

## 2017-05-10 NOTE — Patient Instructions (Signed)
Good to see you  We will get MRi of right knee and see what is going on Lets hold on the back with you responding well to OMT I will write you after the MRI and we will discuss options.  See me again for your back in 4 weeks Happy holidays!

## 2017-05-10 NOTE — Assessment & Plan Note (Signed)
Patient has chronic pain.  Patient is having instability of the knee.  Is failed all conservative therapy including injections and formal physical therapy.  Patient continues to have an aching, dull throbbing sensation.  Patient will continue with conservative therapy but I do feel advanced imaging is warranted now.  Patient would be a candidate for a arthroscopic procedure if needed.  Depending on findings we will discuss further management thereafter.  Follow-up again after that.

## 2017-05-10 NOTE — Assessment & Plan Note (Signed)
Continues to need to work on Air cabin crewposture and ergonomics.  Discussed with patient about core strength.  We discussed ability.  Patient will continue with the same medications at this time.  May make changes in the long run.  No x-rays ordered today.  If any radicular symptoms further aggressive imaging will be needed.  Does respond fairly well to osteopathic depilation and follow-up in 4 weeks

## 2017-05-10 NOTE — Assessment & Plan Note (Signed)
Decision today to treat with OMT was based on Physical Exam  After verbal consent patient was treated with HVLA, ME, FPR techniques in cervical, thoracic, lumbar and sacral areas  Patient tolerated the procedure well with improvement in symptoms  Patient given exercises, stretches and lifestyle modifications  See medications in patient instructions if given  Patient will follow up in 4 weeks 

## 2017-05-31 ENCOUNTER — Ambulatory Visit: Admitting: Family Medicine

## 2017-06-11 ENCOUNTER — Ambulatory Visit
Admission: RE | Admit: 2017-06-11 | Discharge: 2017-06-11 | Disposition: A | Discharge status: Home / Self Care | Source: Ambulatory Visit | Attending: Family Medicine | Admitting: Family Medicine

## 2017-06-11 DIAGNOSIS — M25561 Pain in right knee: Principal | ICD-10-CM

## 2017-06-11 DIAGNOSIS — G8929 Other chronic pain: Secondary | ICD-10-CM

## 2017-06-13 NOTE — Progress Notes (Signed)
Ronald ScaleZach Smith D.O. [facility] [address] [place], [place] [address] Phone: [phone] Subjective:     CC: Knee pain follow-up  NFA:[id number]:Subjective  Ronald SheetsJonathan Carpenter is a 36 y.o. male coming in with complaint of knee pain.  States that he continues to give him pain.  Patient was having locking and giving out on him he stated.  Patient was sent for an MRI.  MRI was independently visualized by me found to have very in the prepatellar and infrapatellar fat and patient's degenerative meniscal tears seem to be healing.  No other significant concern. He continues to have pain.  States that the pain is fairly large.  Patient was started on Cymbalta and states that it has been helpful with some of the chronic pain no.       Past Medical History:  Diagnosis Date  . Anxiety   . Arthritis    knees, ankles  . Complication of anesthesia    PT  NARCOLEPTIC--  "HARD TO GET TO SLEEP AND HARD TO WAKE"  . Depression   . GERD (gastroesophageal reflux disease)   . 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 Hypertension   . Left inguinal hernia   . Light sensitivity   . Migraine    w/ light sensitivity  -- TCI residual  . Narcolepsy    Past Surgical History:  Procedure Laterality Date  . ANKLE SURGERY Left 2010   removal bone fragment  . CIRCUMCISION  2008  . INGUINAL HERNIA REPAIR Left 02/10/2016   Procedure: LAPAROSCOPIC LEFT INGUINAL HERNIA REPAIR WITH MESH;  Surgeon: De BlanchLuke Aaron Kinsinger, MD;  [facility] [place];  Service: General;  Laterality: Left;  . INSERTION OF MESH Left 02/10/2016   Procedure: INSERTION OF MESH;  Surgeon: Rodman PickleLuke Aaron Kinsinger, MD;  [facility] [place];  Service: General;  Laterality: Left;  . LAPAROSCOPIC CHOLECYSTECTOMY  03/2014  . UVULOPALATOPHARYNGOPLASTY  2009   w/ Tonsillectomy and Adenoidectomy   Social History   Socioeconomic History  . Marital status: Married    Spouse  name: None  . Number of children: None  . Years of education: None  . Highest education level: None  Social Needs  . Financial resource strain: None  . Food insecurity - worry: None  . Food insecurity - inability: None  . Transportation needs - medical: None  . Transportation needs - non-medical: None  Occupational History  . Occupation: Designer, industrial/productwarehouse manager    Comment: Engineer, [employer]  Tobacco Use  . Smoking status: Former Smoker    Years: 10.00    Types: Cigarettes    Last attempt to quit: 02/02/2014    Years since quitting: 3.3  . Smokeless tobacco: Current User    Types: Snuff  Substance and Sexual Activity  . Alcohol use: No  . Drug use: No  . Sexual activity: None  Other Topics Concern  . None  Social History Narrative  . None   No Known Allergies Family History  Problem Relation Age of Onset  . Hypertension Mother   . Stroke Mother   . Hypertension Father      Past medical history, social, surgical and family history all reviewed in electronic medical record.  No pertanent information unless stated regarding to the chief complaint.   Review of Systems:Review of systems updated and as accurate as of 06/14/17  No headache, visual changes, nausea, vomiting, diarrhea, constipation, dizziness, abdominal pain,  skin rash, fevers, chills, night sweats, weight loss, swollen lymph nodes,  chest pain, shortness of breath, mood changes.  Positive body aches, muscle aches  Objective  Blood pressure 112/82, pulse 74, weight 253 lb (114.8 kg), SpO2 97 %. Systems examined below as of 06/14/17   General: No apparent distress alert and oriented x3 mood and affect normal, dressed appropriately.  HEENT: Pupils equal, extraocular movements intact  Respiratory: Patient's speak in full sentences and does not appear short of breath  Cardiovascular: No lower extremity edema, non tender, no erythema  Skin: Warm dry intact with no signs of infection or rash on extremities or on axial  skeleton.  Abdomen: Soft nontender  Neuro: Cranial nerves II through XII are intact, neurovascularly intact in all extremities with 2+ DTRs and 2+ pulses.  Lymph: No lymphadenopathy of posterior or anterior cervical chain or axillae bilaterally.  Gait normal with good balance and coordination.  MSK: Mild to moderate tender with full range of motion and good stability and symmetric strength and tone of shoulders, elbows, wrist, hip, and ankles bilaterally.  Knee: Right Normal to inspection with no erythema or effusion or obvious bony abnormalities. Palpation normal with no warmth, joint line tenderness, patellar tenderness, or condyle tenderness. ROM full in flexion and extension and lower leg rotation. Ligaments with solid consistent endpoints including ACL, PCL, LCL, MCL. Mild positive Mcmurray's, Apley's, and Thessalonian tests. Positive painful patellar compression. Patellar glide with mild crepitus. Patellar and quadriceps tendons unremarkable. Hamstring and quadriceps strength is normal. Contralateral knee unremarkable  Back exam shows the patient does have diffuse stiffness with increasing tone of the paraspinal musculature of the thoracolumbar juncture in the lumbosacral area.  Positive Faber bilaterally.  Still has some scapular dyskinesis.  Osteopathic findings C2 flexed rotated and side bent right C4 flexed rotated and side bent left C7 flexed rotated and side bent left T3 extended rotated and side bent right inhaled third rib T7 extended rotated and side bent left L2 flexed rotated and side bent right Sacrum right on right    Impression and Recommendations:     This case required medical decision making of moderate complexity.      Note: This dictation was prepared with Dragon dictation along with smaller phrase technology. Any transcriptional errors that result from this process are unintentional.

## 2017-06-14 ENCOUNTER — Encounter: Payer: Self-pay | Admitting: Family Medicine

## 2017-06-14 ENCOUNTER — Ambulatory Visit (INDEPENDENT_AMBULATORY_CARE_PROVIDER_SITE_OTHER): Admitting: Family Medicine

## 2017-06-14 VITALS — BP 112/82 | HR 74 | Wt 253.0 lb

## 2017-06-14 DIAGNOSIS — M25561 Pain in right knee: Secondary | ICD-10-CM

## 2017-06-14 DIAGNOSIS — M999 Biomechanical lesion, unspecified: Secondary | ICD-10-CM

## 2017-06-14 DIAGNOSIS — G2589 Other specified extrapyramidal and movement disorders: Secondary | ICD-10-CM

## 2017-06-14 DIAGNOSIS — G8929 Other chronic pain: Secondary | ICD-10-CM

## 2017-06-14 MED ORDER — DULOXETINE HCL 40 MG PO CPEP: 40.0000 mg | ORAL_CAPSULE | Freq: Every day | ORAL | 3 refills | Status: AC

## 2017-06-14 NOTE — Assessment & Plan Note (Signed)
Decision today to treat with OMT was based on Physical Exam  After verbal consent patient was treated with HVLA, ME, FPR techniques in cervical, thoracic, lumbar and sacral areas  Patient tolerated the procedure well with improvement in symptoms  Patient given exercises, stretches and lifestyle modifications  See medications in patient instructions if given  Patient will follow up in 4-6 weeks 

## 2017-06-14 NOTE — Assessment & Plan Note (Signed)
Patient has been responding fairly well to osteopathic manipulation will continue with 4-week intervals.  Encourage patient to work on Air cabin crewposture and ergonomics.  Patient will do this on a more regular basis.  Follow-up with me again in 4-6 weeks

## 2017-06-14 NOTE — Patient Instructions (Signed)
Good to see you  Gustavus Bryantce is your friend. Ice 20 minutes 2 times daily. Usually after activity and before bed. Exercises 3 times a week.  Try the new brace See me again in 4 weeks Happy New Year!

## 2017-06-14 NOTE — Assessment & Plan Note (Signed)
MRI unremarkable. Patient on ultrasound did have a small peripheral meniscal tear but seems to be healing.  Likely more the pain seems to be a patellofemoral syndrome.  We discussed ice regimen.  Patient will continue with home exercises.  Patient given prescription for higher dose of the Cymbalta.

## 2017-07-11 NOTE — Progress Notes (Deleted)
Ronald ScaleZach Dequann Carpenter D.O. [facility] [address] [place], [place] [address] Phone: [phone]) [phone] Subjective:    CC: Back pain follow-up  BJY:[id number]:Subjective  Ronald SheetsJonathan Carpenter is a 37 y.o. male coming in with complaint of back pain.  Patient does have scapular dyskinesia as well as sacroiliac dysfunction.  Has chronic pain and we increase Cymbalta to 40 mg 1 month ago.  Patient has responded fairly well to osteopathic manipulation.  Patient states      Past Medical History:  Diagnosis Date  . Anxiety   . Arthritis    knees, ankles  . Complication of anesthesia    PT  NARCOLEPTIC--  "HARD TO GET TO SLEEP AND HARD TO WAKE"  . Depression   . GERD (gastroesophageal reflux disease)   . 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 Hypertension   . Left inguinal hernia   . Light sensitivity   . Migraine    w/ light sensitivity  -- TCI residual  . Narcolepsy    Past Surgical History:  Procedure Laterality Date  . ANKLE SURGERY Left 2010   removal bone fragment  . CIRCUMCISION  2008  . INGUINAL HERNIA REPAIR Left 02/10/2016   Procedure: LAPAROSCOPIC LEFT INGUINAL HERNIA REPAIR WITH MESH;  Surgeon: De BlanchLuke Aaron Kinsinger, MD;  [facility] [place];  Service: General;  Laterality: Left;  . INSERTION OF MESH Left 02/10/2016   Procedure: INSERTION OF MESH;  Surgeon: Rodman PickleLuke Aaron Kinsinger, MD;  [facility] [place];  Service: General;  Laterality: Left;  . LAPAROSCOPIC CHOLECYSTECTOMY  03/2014  . UVULOPALATOPHARYNGOPLASTY  2009   w/ Tonsillectomy and Adenoidectomy   Social History   Socioeconomic History  . Marital status: Married    Spouse name: Not on file  . Number of children: Not on file  . Years of education: Not on file  . Highest education level: Not on file  Social Needs  . Financial resource strain: Not on file  . Food insecurity - worry: Not on file  . Food insecurity - inability: Not on file  .  Transportation needs - medical: Not on file  . Transportation needs - non-medical: Not on file  Occupational History  . Occupation: Designer, industrial/productwarehouse manager    Comment: Engineer, [employer]  Tobacco Use  . Smoking status: Former Smoker    Years: 10.00    Types: Cigarettes    Last attempt to quit: 02/02/2014    Years since quitting: 3.4  . Smokeless tobacco: Current User    Types: Snuff  Substance and Sexual Activity  . Alcohol use: No  . Drug use: No  . Sexual activity: Not on file  Other Topics Concern  . Not on file  Social History Narrative  . Not on file   No Known Allergies Family History  Problem Relation Age of Onset  . Hypertension Mother   . Stroke Mother   . Hypertension Father      Past medical history, social, surgical and family history all reviewed in electronic medical record.  No pertanent information unless stated regarding to the chief complaint.   Review of Systems:Review of systems updated and as accurate as of 07/11/17  No headache, visual changes, nausea, vomiting, diarrhea, constipation, dizziness, abdominal pain, skin rash, fevers, chills, night sweats, weight loss, swollen lymph nodes, body aches, joint swelling, muscle aches, chest pain, shortness of breath, mood changes.   Objective  There were no vitals taken  for this visit. Systems examined below as of 07/11/17   General: No apparent distress alert and oriented x3 mood and affect normal, dressed appropriately.  HEENT: Pupils equal, extraocular movements intact  Respiratory: Patient's speak in full sentences and does not appear short of breath  Cardiovascular: No lower extremity edema, non tender, no erythema  Skin: Warm dry intact with no signs of infection or rash on extremities or on axial skeleton.  Abdomen: Soft nontender  Neuro: Cranial nerves II through XII are intact, neurovascularly intact in all extremities with 2+ DTRs and 2+ pulses.  Lymph: No lymphadenopathy of posterior or anterior  cervical chain or axillae bilaterally.  Gait normal with good balance and coordination.  MSK:  Non tender with full range of motion and good stability and symmetric strength and tone of shoulders, elbows, wrist, hip, knee and ankles bilaterally.     Impression and Recommendations:     This case required medical decision making of moderate complexity.      Note: This dictation was prepared with Dragon dictation along with smaller phrase technology. Any transcriptional errors that result from this process are unintentional.

## 2017-07-12 ENCOUNTER — Ambulatory Visit: Admitting: Family Medicine

## 2017-07-16 ENCOUNTER — Ambulatory Visit: Admitting: Family Medicine

## 2017-07-16 DIAGNOSIS — Z0289 Encounter for other administrative examinations: Secondary | ICD-10-CM

## 2017-07-16 NOTE — Progress Notes (Deleted)
**Note Ronald-Identified via Obfuscation** Ronald ScaleZach Brandan Carpenter D.O. [facility] [address] [place], [place] [address] Phone: [phone]) [phone] Subjective:    I'm seeing this patient by the request  of:    CC:   OZH:[id number]:Subjective  Ronald SheetsJonathan Carpenter is a 37 y.o. male coming in with complaint of ***  Onset-  Location Duration-  Character- Aggravating factors- Reliving factors-  Therapies tried-  Severity-     Past Medical History:  Diagnosis Date  . Anxiety   . Arthritis    knees, ankles  . Complication of anesthesia    PT  NARCOLEPTIC--  "HARD TO GET TO SLEEP AND HARD TO WAKE"  . Depression   . GERD (gastroesophageal reflux disease)   . 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 Hypertension   . Left inguinal hernia   . Light sensitivity   . Migraine    w/ light sensitivity  -- TCI residual  . Narcolepsy    Past Surgical History:  Procedure Laterality Date  . ANKLE SURGERY Left 2010   removal bone fragment  . CIRCUMCISION  2008  . INGUINAL HERNIA REPAIR Left 02/10/2016   Procedure: LAPAROSCOPIC LEFT INGUINAL HERNIA REPAIR WITH MESH;  Surgeon: Ronald BlanchLuke Aaron Kinsinger, MD;  [facility] [place];  Service: General;  Laterality: Left;  . INSERTION OF MESH Left 02/10/2016   Procedure: INSERTION OF MESH;  Surgeon: Ronald PickleLuke Aaron Kinsinger, MD;  [facility] ;  Service: General;  Laterality: Left;  . LAPAROSCOPIC CHOLECYSTECTOMY  03/2014  . UVULOPALATOPHARYNGOPLASTY  2009   w/ Tonsillectomy and Adenoidectomy   Social History   Socioeconomic History  . Marital status: Married    Spouse name: Not on file  . Number of children: Not on file  . Years of education: Not on file  . Highest education level: Not on file  Social Needs  . Financial resource strain: Not on file  . Food insecurity - worry: Not on file  . Food insecurity - inability: Not on file  . Transportation needs - medical: Not on file  . Transportation needs - non-medical:  Not on file  Occupational History  . Occupation: Designer, industrial/productwarehouse manager    Comment: Engineer, [employer]  Tobacco Use  . Smoking status: Former Smoker    Years: 10.00    Types: Cigarettes    Last attempt to quit: 02/02/2014    Years since quitting: 3.4  . Smokeless tobacco: Current User    Types: Snuff  Substance and Sexual Activity  . Alcohol use: No  . Drug use: No  . Sexual activity: Not on file  Other Topics Concern  . Not on file  Social History Narrative  . Not on file   No Known Allergies Family History  Problem Relation Age of Onset  . Hypertension Mother   . Stroke Mother   . Hypertension Father      Past medical history, social, surgical and family history all reviewed in electronic medical record.  No pertanent information unless stated regarding to the chief complaint.   Review of Systems:Review of systems updated and as accurate as of 07/16/17  No headache, visual changes, nausea, vomiting, diarrhea, constipation, dizziness, abdominal pain, skin rash, fevers, chills, night sweats, weight loss, swollen lymph nodes, body aches, joint swelling, muscle aches, chest pain, shortness of breath, mood changes.   Objective  There were no vitals taken for this visit. Systems examined below as of 07/16/17   General: No  apparent distress alert and oriented x3 mood and affect normal, dressed appropriately.  HEENT: Pupils equal, extraocular movements intact  Respiratory: Patient's speak in full sentences and does not appear short of breath  Cardiovascular: No lower extremity edema, non tender, no erythema  Skin: Warm dry intact with no signs of infection or rash on extremities or on axial skeleton.  Abdomen: Soft nontender  Neuro: Cranial nerves II through XII are intact, neurovascularly intact in all extremities with 2+ DTRs and 2+ pulses.  Lymph: No lymphadenopathy of posterior or anterior cervical chain or axillae bilaterally.  Gait normal with good balance and coordination.   MSK:  Non tender with full range of motion and good stability and symmetric strength and tone of shoulders, elbows, wrist, hip, knee and ankles bilaterally.     Impression and Recommendations:     This case required medical decision making of moderate complexity.      Note: This dictation was prepared with Dragon dictation along with smaller phrase technology. Any transcriptional errors that result from this process are unintentional.

## 2019-12-30 ENCOUNTER — Emergency Department (HOSPITAL_COMMUNITY)
Admission: EM | Admit: 2019-12-30 | Discharge: 2019-12-31 | Disposition: A | Discharge status: Home / Self Care | Payer: Medicare Other | Attending: Emergency Medicine | Admitting: Emergency Medicine

## 2019-12-30 ENCOUNTER — Other Ambulatory Visit: Payer: Self-pay

## 2019-12-30 ENCOUNTER — Emergency Department (HOSPITAL_COMMUNITY): Payer: Medicare Other

## 2019-12-30 DIAGNOSIS — N50811 Right testicular pain: Secondary | ICD-10-CM | POA: Diagnosis present

## 2019-12-30 DIAGNOSIS — R1030 Lower abdominal pain, unspecified: Secondary | ICD-10-CM | POA: Diagnosis not present

## 2019-12-30 DIAGNOSIS — Z87891 Personal history of nicotine dependence: Secondary | ICD-10-CM | POA: Diagnosis not present

## 2019-12-30 DIAGNOSIS — I10 Essential (primary) hypertension: Secondary | ICD-10-CM | POA: Insufficient documentation

## 2019-12-30 DIAGNOSIS — Z79899 Other long term (current) drug therapy: Secondary | ICD-10-CM | POA: Diagnosis not present

## 2019-12-30 DIAGNOSIS — R109 Unspecified abdominal pain: Secondary | ICD-10-CM

## 2019-12-30 LAB — URINALYSIS, ROUTINE W REFLEX MICROSCOPIC
Bilirubin Urine: NEGATIVE
Glucose, UA: NEGATIVE mg/dL
Hgb urine dipstick: NEGATIVE
Ketones, ur: NEGATIVE mg/dL
Leukocytes,Ua: NEGATIVE
Nitrite: NEGATIVE
Protein, ur: NEGATIVE mg/dL
Specific Gravity, Urine: 1.012 (ref 1.005–1.030)
pH: 6 (ref 5.0–8.0)

## 2019-12-30 MED ORDER — SODIUM CHLORIDE 0.9 % IV BOLUS (SEPSIS)
1000.0000 mL | Freq: Once | INTRAVENOUS | Status: AC
  Administered 2019-12-31: 1000 mL via INTRAVENOUS

## 2019-12-30 MED ORDER — KETOROLAC TROMETHAMINE 30 MG/ML IJ SOLN
30.0000 mg | Freq: Once | INTRAMUSCULAR | Status: AC
  Administered 2019-12-31: 30 mg via INTRAVENOUS
  Filled 2019-12-30: qty 1

## 2019-12-30 NOTE — ED Notes (Signed)
Patient provided with urinal and made aware urine sample is needed. 

## 2019-12-30 NOTE — ED Notes (Signed)
US at bedside

## 2019-12-30 NOTE — ED Notes (Signed)
ED Provider at bedside. 

## 2019-12-30 NOTE — ED Provider Notes (Signed)
11:28 PM  Assumed care.  Patient is a 39 year old male who presents to the emergency department with testicular pain.  Ultrasound shows no acute abnormality.  On reevaluation, patient states pain started in the right testicle around 2 PM today and worsened with standing.  He states then pain was in the posterior thigh and now pain is in the right flank and right lower quadrant.  Does report history of kidney stone at 39 years old.  He has had previous cholecystectomy but still has his appendix.  He denies fevers, chills, nausea, vomiting, diarrhea, dysuria, hematuria, penile discharge.  Denies any concerns for STDs.   His testicular exam is normal.  There is no hernia appreciated.  No masses.  He is tender in the right lower quadrant with voluntary guarding.  No RLE pain, deformity, swelling, redness or warmth.  RLE is warm and well perfused.  Will obtain labs, urine and CT study to evaluate for kidney stone versus appendicitis.   2:25 AM Pt's labs are reassuring.  No leukocytosis.  Normal LFTs and lipase.  Urine shows no sign of infection or blood.  He has declined STD screening.  CT scan shows loose stool throughout the colon but he denies any diarrhea.  Appendix is not visualized but there are no inflammatory changes in the right lower quadrant.  Kidneys, bladder appear normal.  No sign of stone.  Unclear etiology for patient's pain.  Could be secondary to developing viral gastroenteritis versus musculoskeletal in nature.  We did discuss at length return precautions especially given symptoms just started at 2 PM yesterday and appendix was not clearly visualized although with no fever, leukocytosis and no inflammatory changes 12 hours after symptoms of started, I feel appendicitis is less likely.  I feel he is safe to be discharged home and alternate Tylenol and ibuprofen for pain.  He appears comfortable, sleeping when I entered the room after a dose of Toradol.   At this time, I do not feel there is any  life-threatening condition present. I have reviewed, interpreted and discussed all results (EKG, imaging, lab, urine as appropriate) and exam findings with patient/family. I have reviewed nursing notes and appropriate previous records.  I feel the patient is safe to be discharged home without further emergent workup and can continue workup as an outpatient as needed. Discussed usual and customary return precautions. Patient/family verbalize understanding and are comfortable with this plan.  Outpatient follow-up has been provided as needed. All questions have been answered.    Maceo Hernan, Layla Maw, DO 12/31/19 [phone]

## 2019-12-30 NOTE — ED Provider Notes (Signed)
[facility]-EMERGENCY DEPT Provider Note   CSN: [id number] Arrival date & time: 12/30/19  2148     History Chief Complaint  Patient presents with  . Testicle Pain    Ronald Carpenter is a 39 y.o. male.  HPI      Right testicular pain began around 2PM Radiation to leg and buttock Began while walking to truck, had slight pain in right testicle, felt it more after getting into the truck and picked mother up and while standing there for appointment really began to feel it Was somewhat improved with sitting down. Standing more painful. Laying down helps the most Was 4/10 now 6-7/10, laying down 1 or 2 No fever, chills, nausea or vomiting, no dysuria or discharge   No trauma  Past Medical History:  Diagnosis Date  . Anxiety   . Arthritis    knees, ankles  . Complication of anesthesia    PT  NARCOLEPTIC--  "HARD TO GET TO SLEEP AND HARD TO WAKE"  . Depression   . GERD (gastroesophageal reflux disease)   . 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 Hypertension   . Left inguinal hernia   . Light sensitivity   . Migraine    w/ light sensitivity  -- TCI residual  . Narcolepsy     Patient Active Problem List   Diagnosis Date Noted  . Chronic pain of right knee 04/09/2017  . Scapular dyskinesis 03/11/2017  . Nonallopathic lesion of cervical region 03/11/2017  . Nonallopathic lesion of thoracic region 03/11/2017  . Nonallopathic lesion of lumbosacral region 03/11/2017  . Tear of left meniscus as current injury 12/28/2016  . Pes anserine bursitis 11/30/2016  . Left knee pain 11/04/2016  . Left inguinal hernia 01/22/2016  . Hypertension 01/06/2016  . Narcolepsy 01/06/2016  . Anxiety 01/06/2016  . Depression 01/06/2016    Past Surgical History:  Procedure Laterality Date  . ANKLE SURGERY Left 2010   removal bone fragment  . CIRCUMCISION  2008  . INGUINAL HERNIA REPAIR Left 02/10/2016   Procedure:  LAPAROSCOPIC LEFT INGUINAL HERNIA REPAIR WITH MESH;  Surgeon: De Blanch Kinsinger, MD;  [facility];  Service: General;  Laterality: Left;  . INSERTION OF MESH Left 02/10/2016   Procedure: INSERTION OF MESH;  Surgeon: Rodman Pickle, MD;  Location: [facility];  Service: General;  Laterality: Left;  . LAPAROSCOPIC CHOLECYSTECTOMY  03/2014  . UVULOPALATOPHARYNGOPLASTY  2009   w/ Tonsillectomy and Adenoidectomy       Family History  Problem Relation Age of Onset  . Hypertension Mother   . Stroke Mother   . Hypertension Father     Social History   Tobacco Use  . Smoking status: Former Smoker    Years: 10.00    Types: Cigarettes    Quit date: 02/02/2014    Years since quitting: 5.9  . Smokeless tobacco: Current User    Types: Snuff  Substance Use Topics  . Alcohol use: No  . Drug use: No    Home Medications Prior to Admission medications   Medication Sig Start Date End Date Taking? Authorizing Provider  DULoxetine HCl 40 MG CPEP Take 40 mg by mouth daily. 06/14/17   Judi Saa, DO  ibuprofen (ADVIL,MOTRIN) 800 MG tablet Take 1 tablet (800 mg total) by mouth every 8 (eight) hours as needed. 02/10/16   Kinsinger, De Blanch, MD  methylphenidate (RITALIN LA) 30 MG  24 hr capsule Take 30 mg by mouth 2 (two) times daily. AM UPON WAKING  AND AT LUNCH TIME    [provider]  methylphenidate (RITALIN) 5 MG tablet Take 10 mg by mouth daily as needed (For narcolepsy.).     [provider]  OLANZapine (ZYPREXA) 5 MG tablet Take 5 mg by mouth at bedtime.    [provider]  Oxcarbazepine (TRILEPTAL) 300 MG tablet Take 450 mg by mouth 2 (two) times daily.    [provider]  prazosin (MINIPRESS) 2 MG capsule Take 3 mg by mouth at bedtime.     [provider]  propranolol (INDERAL) 10 MG tablet Take 10 mg by mouth 3 (three) times daily.     [provider]  sertraline (ZOLOFT) 100 MG  tablet Take 100 mg by mouth daily.    [provider]  telmisartan-hydrochlorothiazide (MICARDIS HCT) 40-12.5 MG per tablet Take 1 tablet by mouth every morning.    [provider]  traMADol (ULTRAM) 50 MG tablet Take 1 tablet (50 mg total) by mouth every 8 (eight) hours as needed. 01/08/16   Valarie Cones, Dema Severin, PA-C    Allergies    Patient has no known allergies.  Review of Systems   Review of Systems  Constitutional: Negative for fever.  Gastrointestinal: Positive for abdominal pain.  Genitourinary: Positive for flank pain and testicular pain. Negative for discharge.  Skin: Negative for wound.    Physical Exam Updated Vital Signs BP 103/79   Pulse 68   Temp 98.7 F (37.1 C) (Oral)   Resp 18   Ht 6\' 2"  (1.88 m)   Wt 99.8 kg   SpO2 99%   BMI 28.25 kg/m   Physical Exam Vitals and nursing note reviewed.  Constitutional:      General: He is not in acute distress.    Appearance: Normal appearance. He is not ill-appearing, toxic-appearing or diaphoretic.  HENT:     Head: Normocephalic.  Eyes:     Conjunctiva/sclera: Conjunctivae normal.  Cardiovascular:     Rate and Rhythm: Normal rate and regular rhythm.     Pulses: Normal pulses.  Pulmonary:     Effort: Pulmonary effort is normal. No respiratory distress.  Abdominal:     Hernia: There is no hernia in the left inguinal area or right inguinal area.  Genitourinary:    Testes:        Right: Tenderness present.        Left: Mass or tenderness not present.     Epididymis:     Right: Tenderness present.  Musculoskeletal:        General: No deformity or signs of injury.     Cervical back: No rigidity.  Skin:    General: Skin is warm and dry.     Coloration: Skin is not jaundiced or pale.  Neurological:     General: No focal deficit present.     Mental Status: He is alert and oriented to person, place, and time.     ED Results / Procedures / Treatments   Labs (all labs ordered are listed, but only  abnormal results are displayed) Labs Reviewed  COMPREHENSIVE METABOLIC PANEL - Abnormal; Notable for the following components:      Result Value   Glucose, Bld 102 (*)    Total Protein 6.0 (*)    All other components within normal limits  URINALYSIS, ROUTINE W REFLEX MICROSCOPIC  CBC WITH DIFFERENTIAL/PLATELET  LIPASE, BLOOD  GC/CHLAMYDIA PROBE AMP (  [place]) NOT AT [facility]    EKG None  Radiology CT ABDOMEN PELVIS W CONTRAST  Result Date: 12/31/2019 CLINICAL DATA:  39 year old male with right lower quadrant abdominal pain. EXAM: CT ABDOMEN AND PELVIS WITH CONTRAST TECHNIQUE: Multidetector CT imaging of the abdomen and pelvis was performed using the standard protocol following bolus administration of intravenous contrast. CONTRAST:  OMNIPAQUE IOHEXOL 300 MG/ML  SOLN COMPARISON:  CT abdomen pelvis dated 01/07/2016. FINDINGS: Lower chest: The visualized lung bases are clear. No intra-abdominal free air or free fluid Hepatobiliary: The liver is unremarkable. No intrahepatic biliary ductal dilatation. Cholecystectomy. Pancreas: Unremarkable. No pancreatic ductal dilatation or surrounding inflammatory changes. Spleen: Normal in size without focal abnormality. Adrenals/Urinary Tract: The adrenal glands unremarkable. The kidneys, visualized ureters, and urinary bladder appear unremarkable. Stomach/Bowel: Several small scattered sigmoid diverticula without active inflammatory changes. There is loose stool throughout the colon compatible with diarrheal state. Correlation with clinical exam and stool cultures recommended. There is a 3 cm distal duodenal diverticula without active inflammatory changes. There is no bowel obstruction. The appendix is not visualized with certainty. No inflammatory changes identified in the right lower quadrant. Vascular/Lymphatic: The abdominal aorta and IVC unremarkable. No portal venous gas. There is no adenopathy. Reproductive: The prostate and seminal vesicles are  grossly unremarkable. Other: None Musculoskeletal: No acute or significant osseous findings. IMPRESSION: 1. Diarrheal state. Correlation with clinical exam and stool cultures recommended. No bowel obstruction. 2. A 3 cm distal duodenal diverticula without active inflammatory changes. Electronically Signed   By: Ronald Carpenter M.D.   On: 12/31/2019 02:06   US SCROTUM W/DOPPLER  Result Date: 12/30/2019 CLINICAL DATA:  39 year old male with right testicular pain. EXAM: SCROTAL ULTRASOUND DOPPLER ULTRASOUND OF THE TESTICLES TECHNIQUE: Complete ultrasound examination of the testicles, epididymis, and other scrotal structures was performed. Color and spectral Doppler ultrasound were also utilized to evaluate blood flow to the testicles. COMPARISON:  None. FINDINGS: Right testicle Measurements: 4.0 x 2.1 x 2.4 cm. No mass or microlithiasis visualized. Left testicle Measurements: 4.3 x 2.8 x 3.0 cm. No mass or microlithiasis visualized. Right epididymis:  Normal in size and appearance. Left epididymis:  Normal in size and appearance. Hydrocele:  None visualized. Varicocele:  None visualized. Pulsed Doppler interrogation of both testes demonstrates normal low resistance arterial and venous waveforms bilaterally. IMPRESSION: Unremarkable testicular ultrasound. Electronically Signed   By: Ronald Carpenter M.D.   On: 12/30/2019 23:09    Procedures Procedures (including critical care time)  Medications Ordered in ED Medications  sodium chloride 0.9 % bolus 1,000 mL (0 mLs Intravenous Stopped 12/31/19 0115)  ketorolac (TORADOL) 30 MG/ML injection 30 mg (30 mg Intravenous Given 12/31/19 0015)  iohexol (OMNIPAQUE) 300 MG/ML solution 100 mL (100 mLs Intravenous Contrast Given 12/31/19 0123)    ED Course  I have reviewed the triage vital signs and the nursing notes.  Pertinent labs & imaging results that were available during my care of the patient were reviewed by me and considered in my medical decision making  (see chart for details).    MDM Rules/Calculators/A&P                          39yo male presents with concern for right testicular pain.  Testicular US ordered, returned showing no abnormality. DDx continues to include nephrolithiasis or MSK etiology of pain. Dr. Elesa Massed assumed care of pt.    Final Clinical Impression(s) / ED Diagnoses Final diagnoses:  Right flank pain  Right testicular pain    Rx / DC Orders ED Discharge Orders    None       Alvira MondaySchlossman, Arshiya Jakes, MD 01/01/20 [phone]

## 2019-12-30 NOTE — ED Triage Notes (Signed)
Pt reports right testicle pain onset around 2pm today. Denies event or injury states sudden onset  Hx hernia

## 2019-12-31 ENCOUNTER — Encounter (HOSPITAL_COMMUNITY): Payer: Self-pay

## 2019-12-31 ENCOUNTER — Emergency Department (HOSPITAL_COMMUNITY): Payer: Medicare Other

## 2019-12-31 DIAGNOSIS — N50811 Right testicular pain: Secondary | ICD-10-CM | POA: Diagnosis not present

## 2019-12-31 LAB — COMPREHENSIVE METABOLIC PANEL
ALT: 27 U/L (ref 0–44)
AST: 20 U/L (ref 15–41)
Albumin: 3.9 g/dL (ref 3.5–5.0)
Alkaline Phosphatase: 64 U/L (ref 38–126)
Anion gap: 10 (ref 5–15)
BUN: 12 mg/dL (ref 6–20)
CO2: 29 mmol/L (ref 22–32)
Calcium: 9 mg/dL (ref 8.9–10.3)
Chloride: 100 mmol/L (ref 98–111)
Creatinine, Ser: 0.99 mg/dL (ref 0.61–1.24)
GFR calc Af Amer: >60 mL/min (ref >60)
GFR calc non Af Amer: >60 mL/min (ref >60)
Glucose, Bld: 102 mg/dL — ABNORMAL HIGH (ref 70–99)
Potassium: 3.8 mmol/L (ref 3.5–5.1)
Sodium: 139 mmol/L (ref 135–145)
Total Bilirubin: 0.5 mg/dL (ref 0.3–1.2)
Total Protein: 6 g/dL — ABNORMAL LOW (ref 6.5–8.1)

## 2019-12-31 LAB — LIPASE, BLOOD: Lipase: 20 U/L (ref 11–51)

## 2019-12-31 LAB — CBC WITH DIFFERENTIAL/PLATELET
Abs Immature Granulocytes: 0.02 10*3/uL (ref 0.00–0.07)
Basophils Absolute: 0 10*3/uL (ref 0.0–0.1)
Basophils Relative: 0 %
Eosinophils Absolute: 0.2 10*3/uL (ref 0.0–0.5)
Eosinophils Relative: 3 %
HCT: 46.8 % (ref 39.0–52.0)
Hemoglobin: 15.6 g/dL (ref 13.0–17.0)
Immature Granulocytes: 0 %
Lymphocytes Relative: 41 %
Lymphs Abs: 3.7 10*3/uL (ref 0.7–4.0)
MCH: 30.5 pg (ref 26.0–34.0)
MCHC: 33.3 g/dL (ref 30.0–36.0)
MCV: 91.6 fL (ref 80.0–100.0)
Monocytes Absolute: 0.8 10*3/uL (ref 0.1–1.0)
Monocytes Relative: 9 %
Neutro Abs: 4.1 10*3/uL (ref 1.7–7.7)
Neutrophils Relative %: 47 %
Platelets: 199 10*3/uL (ref 150–400)
RBC: 5.11 MIL/uL (ref 4.22–5.81)
RDW: 12.6 % (ref 11.5–15.5)
WBC: 8.9 10*3/uL (ref 4.0–10.5)
nRBC: 0 % (ref 0.0–0.2)

## 2019-12-31 MED ORDER — SODIUM CHLORIDE (PF) 0.9 % IJ SOLN
INTRAMUSCULAR | Status: AC
  Filled 2019-12-31: qty 50

## 2019-12-31 MED ORDER — IOHEXOL 300 MG/ML  SOLN
100.0000 mL | Freq: Once | INTRAMUSCULAR | Status: AC | PRN
  Administered 2019-12-31: 01:00:00 100 mL via INTRAVENOUS

## 2019-12-31 NOTE — Discharge Instructions (Signed)
Your labs, urine, testicular ultrasound and CT scan today were normal.  We were not able to visualize your appendix but did not see any inflammatory changes around where the appendix is located and you have no elevated white blood cell count or fever.  If you develop worsening pain that is uncontrolled, have vomiting that does not stop, blood in your stool, fever of 100.4 or higher, swelling of your testicle or scrotum, unable to urinate, penile discharge or you are urinating blood, please return to the emergency department.  I recommend close follow-up with a primary care physician or ED in 24 to 48 hours if symptoms have not improved or resolved.  You may alternate Tylenol 1000 mg every 6 hours as needed for pain and Ibuprofen 800 mg every 8 hours as needed for pain.  Please take Ibuprofen with food.  Do not take more than 4000 mg of Tylenol (acetaminophen) in a 24 hour period.   Steps to find a Primary Care Provider (PCP):  Call [phone] or [phone] to access "[place] Find a Doctor Service."  2.  You may also go on the [facility] website at [email or web address]  3.  [facility] also frequently accepts new patients.  [facility]  [address] [place] [address] [phone]  4.  There are also multiple [facility], [facility] and [facility] practices throughout the [place] that are frequently accepting new patients. You may find a clinic that is close to your home and contact them.  [email or web address] [phone]  [facility] [email or web address]  [email or web address] [phone]  [facility] [email or web address] [phone]  5.  Local Health Departments also can provide primary care services.  [facility]  [address] [place] [address] [phone]  [facility] [address]  [place] [address] [phone]  [facility] [address] [place] [address]  [place] [address] [phone]

## 2020-01-01 LAB — GC/CHLAMYDIA PROBE AMP (~~LOC~~) NOT AT ARMC
Chlamydia: NEGATIVE
Comment: NEGATIVE
Comment: NORMAL
Neisseria Gonorrhea: NEGATIVE

## 2021-01-11 IMAGING — US US SCROTUM W/ DOPPLER COMPLETE
1 series · 14 of 25 positions shown · non-contrast
Comparison: None.

CLINICAL DATA: 39-year-old male with right testicular pain.

EXAM:
SCROTAL ULTRASOUND
DOPPLER ULTRASOUND OF THE TESTICLES
TECHNIQUE: Complete ultrasound examination of the testicles, epididymis, and
other scrotal structures was performed. Color and spectral Doppler
ultrasound were also utilized to evaluate blood flow to the
testicles.

[Series 1: us scrotum w/ doppler complete · 14 of 54 slices shown]
[im 1/54]
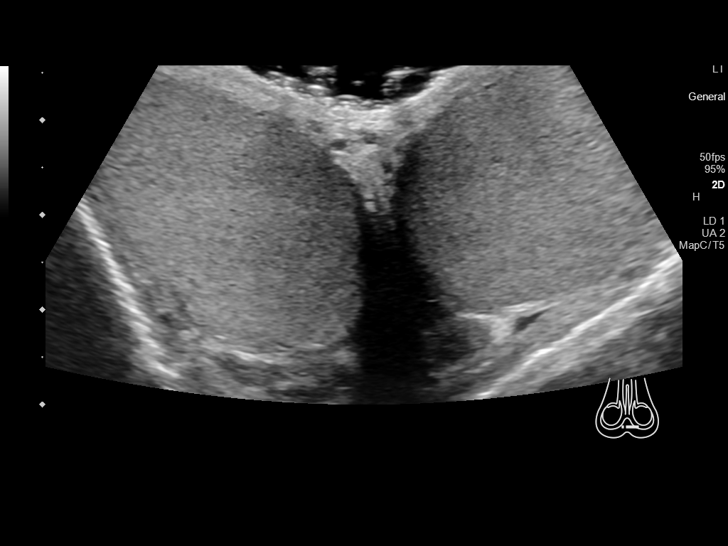
[im 5/54]
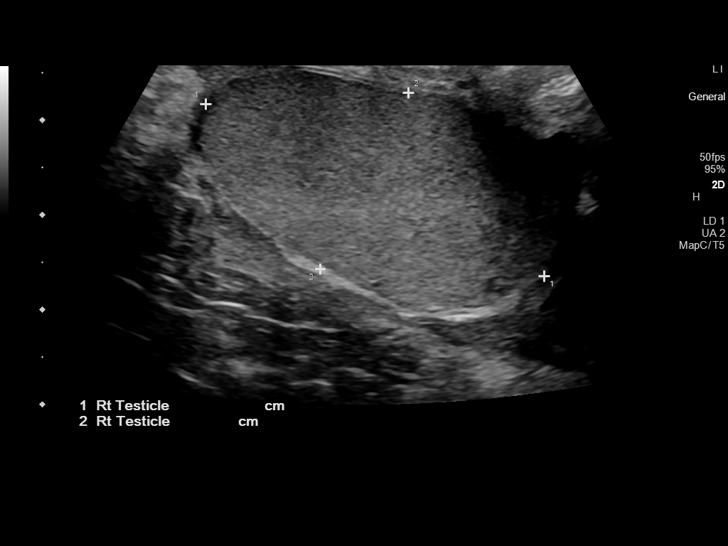
[im 9/54]
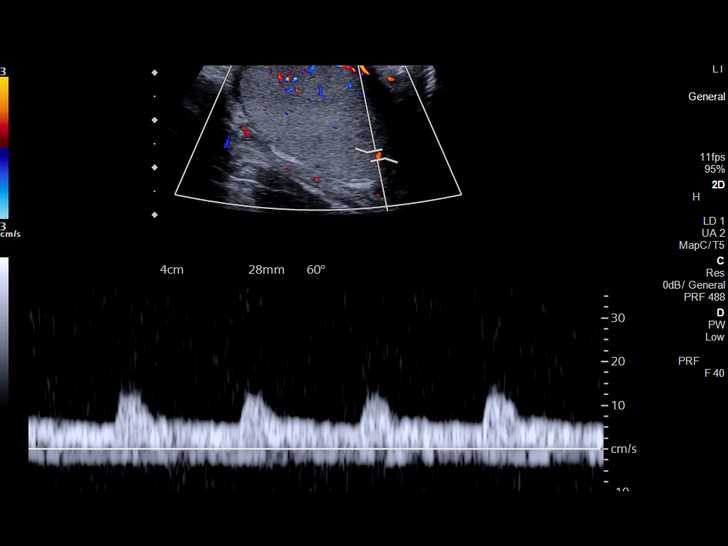
[im 14/54]
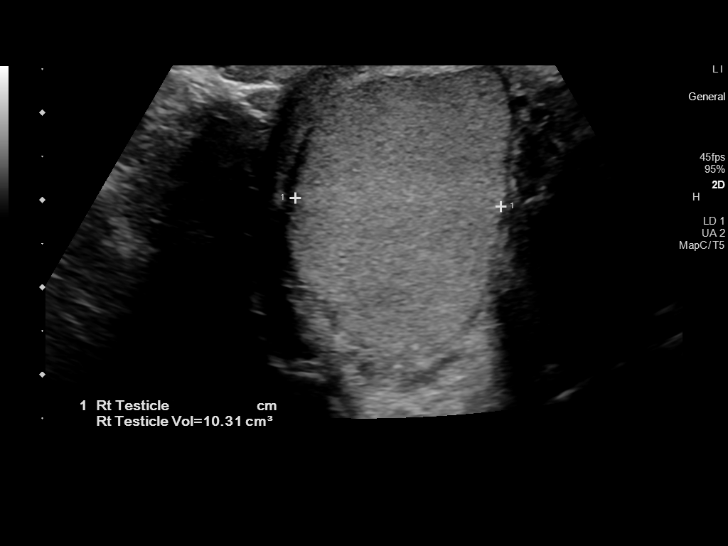
[im 18/54]
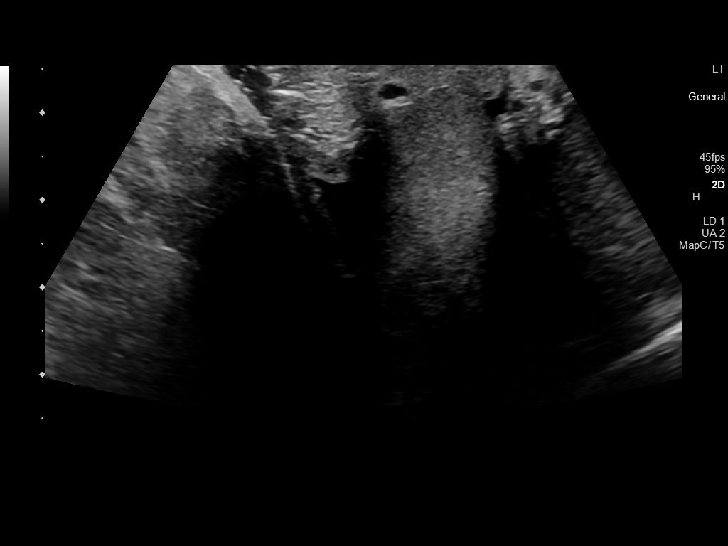
[im 20/54]
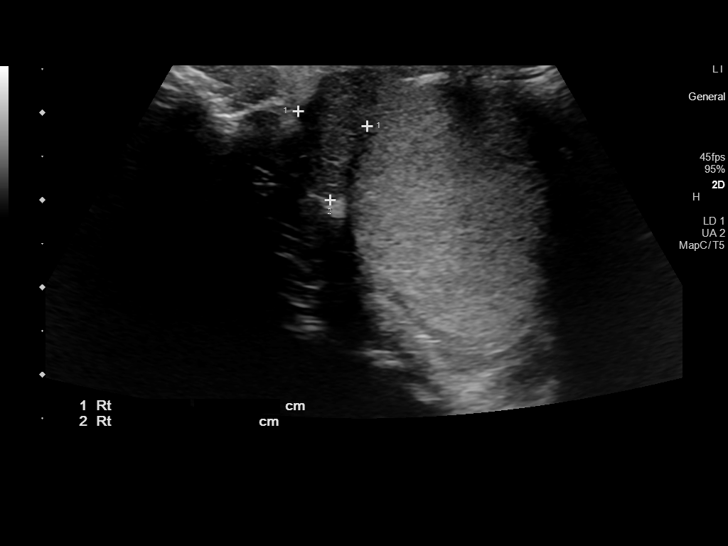
[im 25/54]
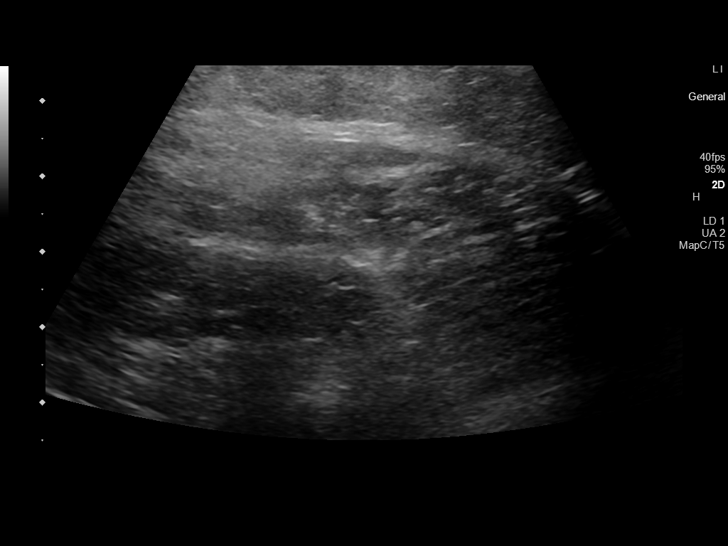
[im 29/54]
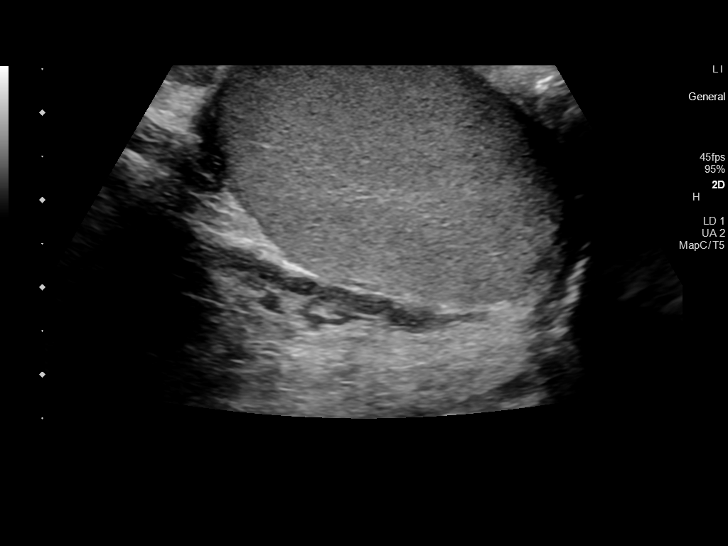
[im 34/54]
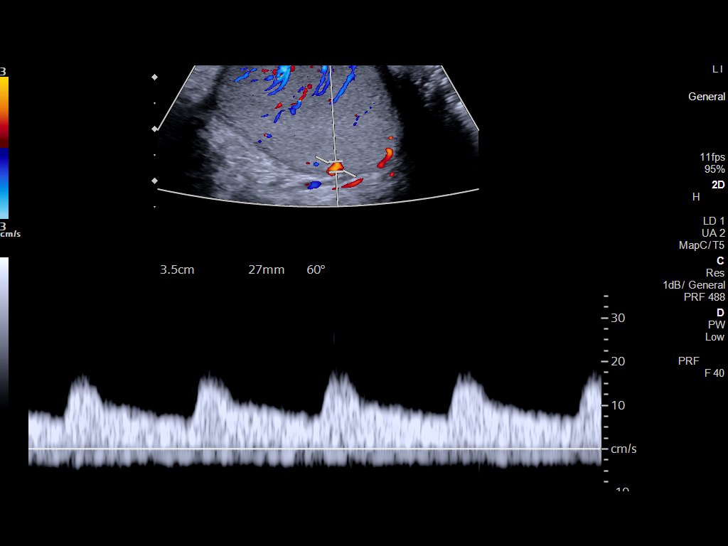
[im 36/54]
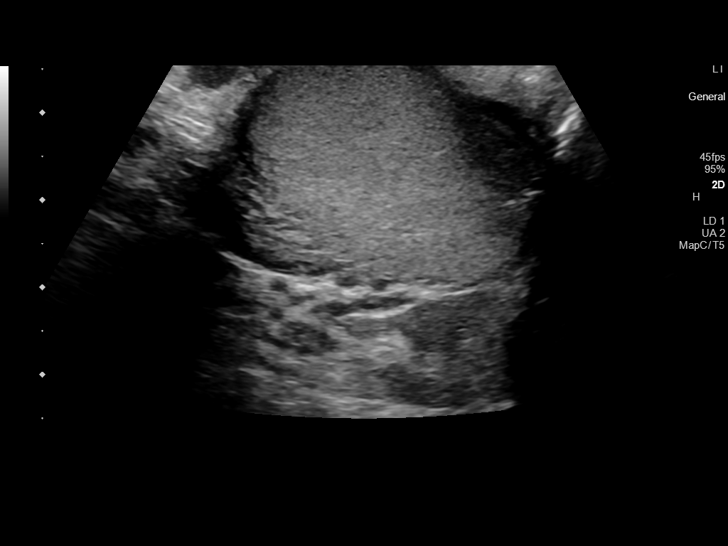
[im 40/54]
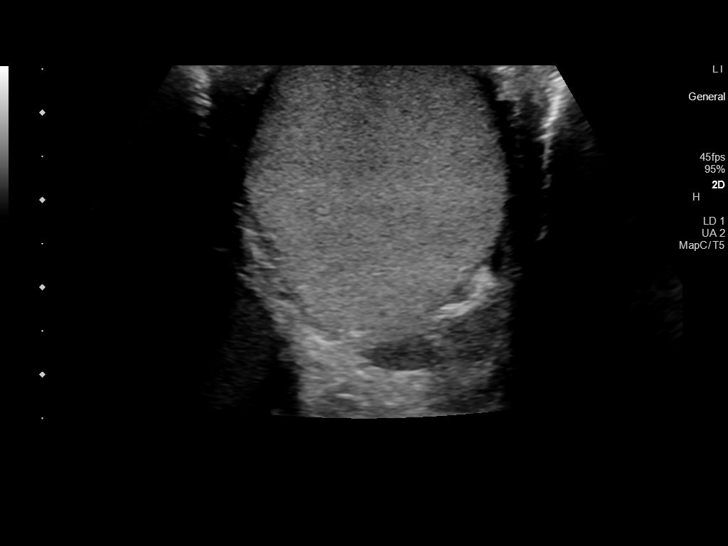
[im 45/54]
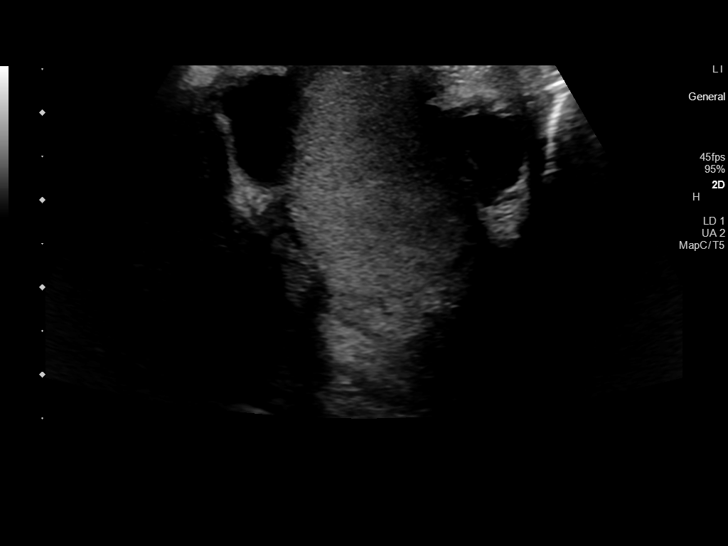
[im 49/54]
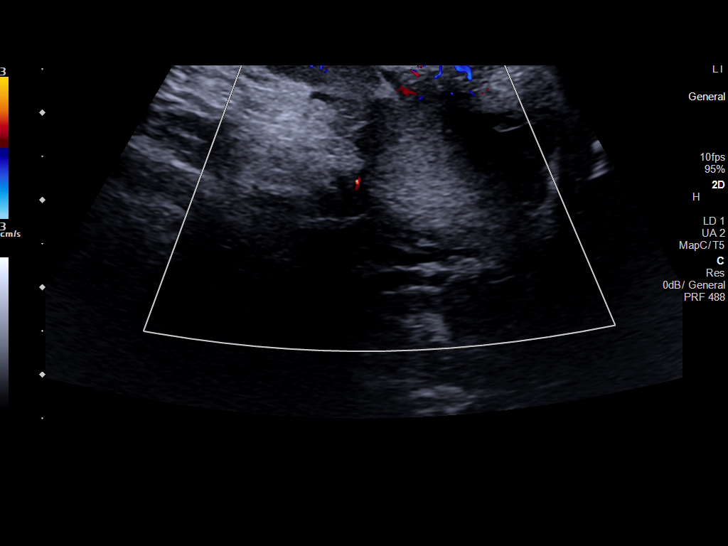
[im 54/54]
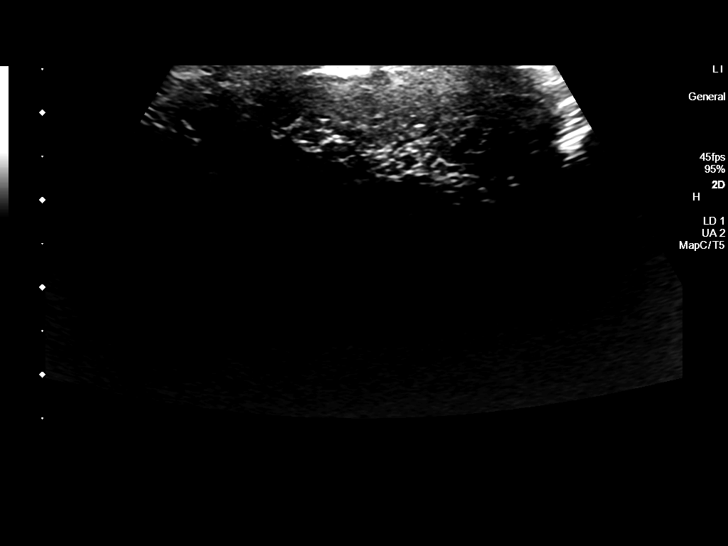

[14 of 25 positions shown; findings below may reference images not displayed]

FINDINGS: Right testicle

Measurements: 4.0 x 2.1 x 2.4 cm. No mass or microlithiasis
visualized.

Left testicle

Measurements: 4.3 x 2.8 x 3.0 cm. No mass or microlithiasis
visualized.

Right epididymis:  Normal in size and appearance.

Left epididymis:  Normal in size and appearance.

Hydrocele:  None visualized.

Varicocele:  None visualized.

Pulsed Doppler interrogation of both testes demonstrates normal low
resistance arterial and venous waveforms bilaterally.
IMPRESSION: Unremarkable testicular ultrasound.

## 2021-01-12 IMAGING — CT CT ABD-PELV W/ CM
2 of 4 series · 15 of 46 positions shown, 17 images · IV contrast (omnipaque)
Comparison: CT abdomen pelvis dated 01/07/2016.

CLINICAL DATA: 39-year-old male with right lower quadrant abdominal
pain.

EXAM:
CT ABDOMEN AND PELVIS WITH CONTRAST
TECHNIQUE: Multidetector CT imaging of the abdomen and pelvis was performed
using the standard protocol following bolus administration of
intravenous contrast.
CONTRAST:  100mL OMNIPAQUE IOHEXOL 300 MG/ML  SOLN

[Series 2: axial st · axial · 0.89mm/px · z∈[-554,-8]mm · 12 of 123 slices shown, 14 images]
[im 7/123  soft-tissue]
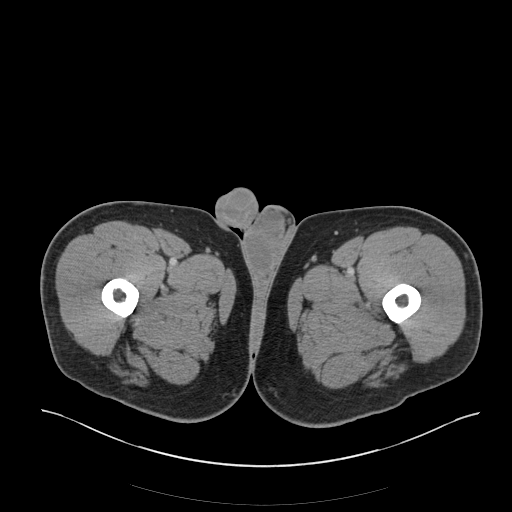
[im 7/123  bone]
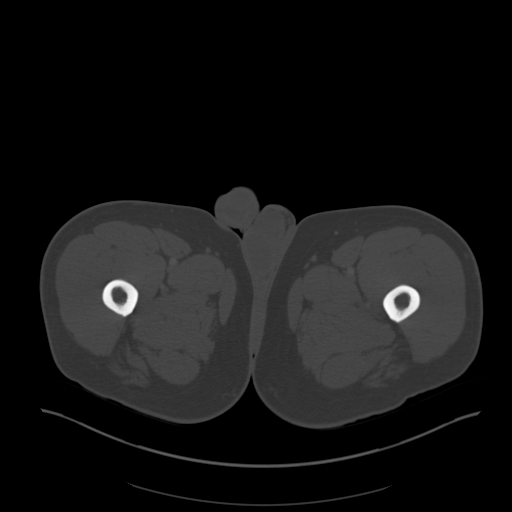
[im 20/123  soft-tissue]
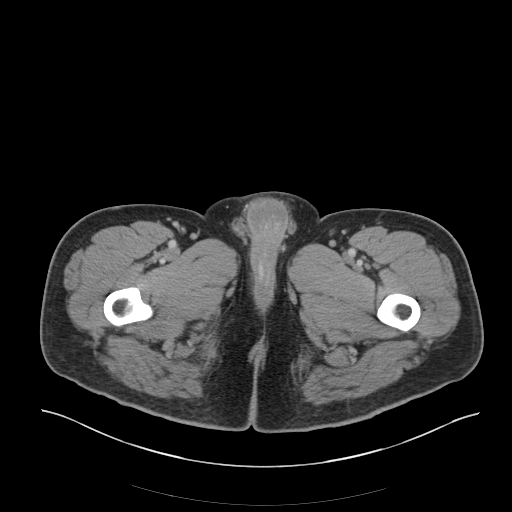
[im 26/123  soft-tissue]
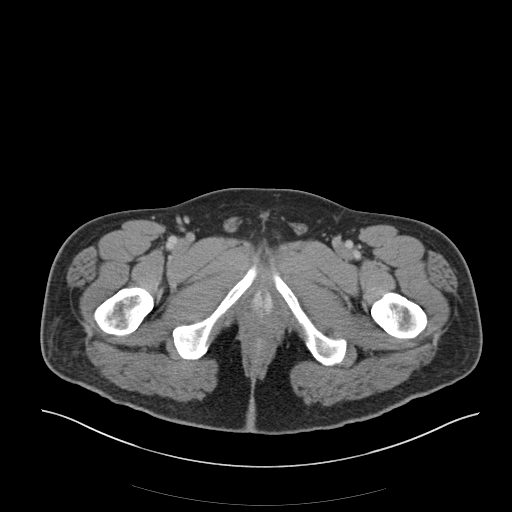
[im 39/123  soft-tissue]
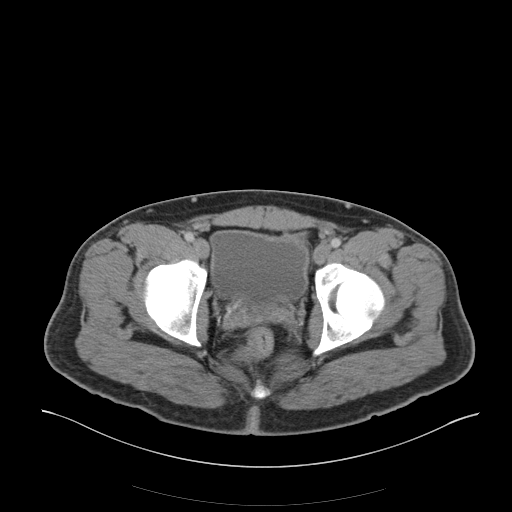
[im 45/123  soft-tissue]
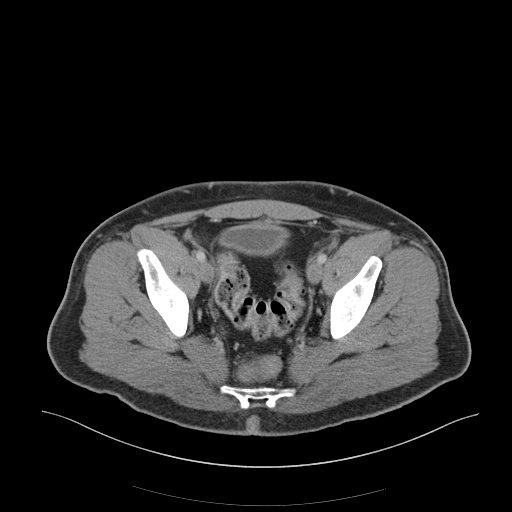
[im 58/123  soft-tissue]
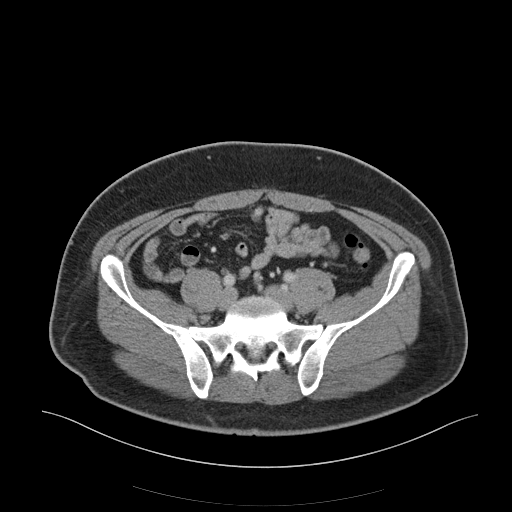
[im 65/123  soft-tissue]
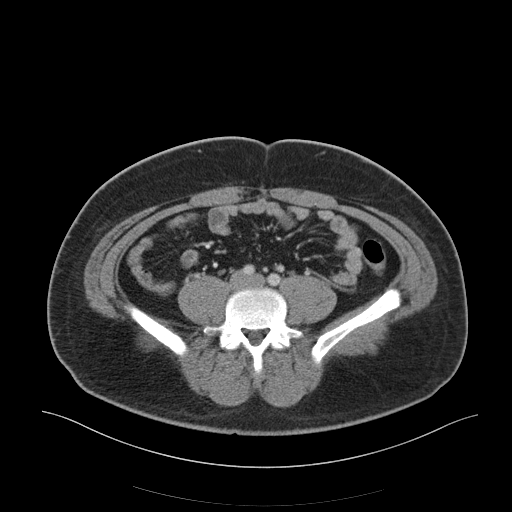
[im 78/123  soft-tissue]
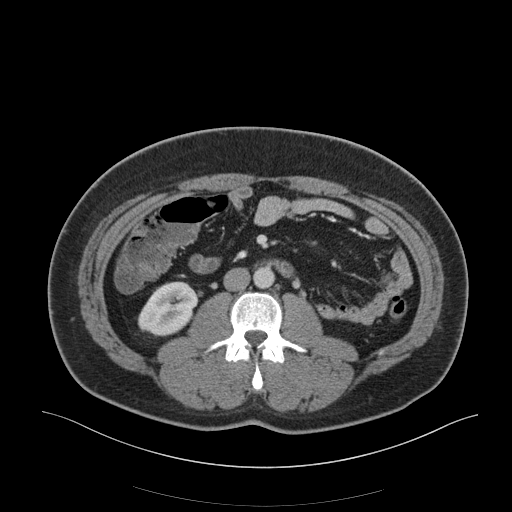
[im 84/123  soft-tissue]
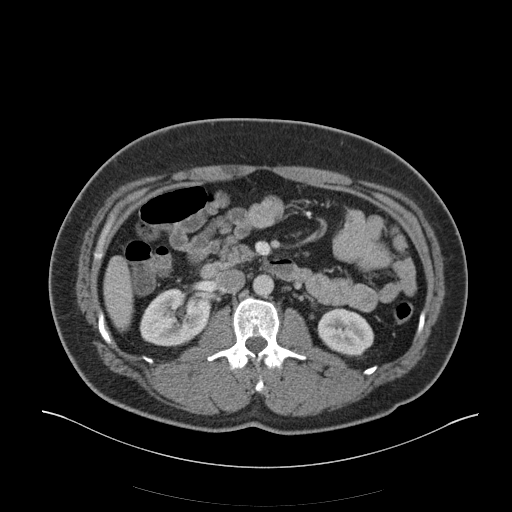
[im 84/123  bone]
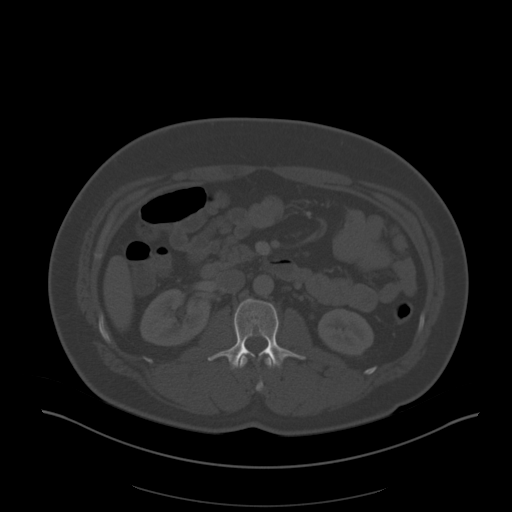
[im 97/123  soft-tissue]
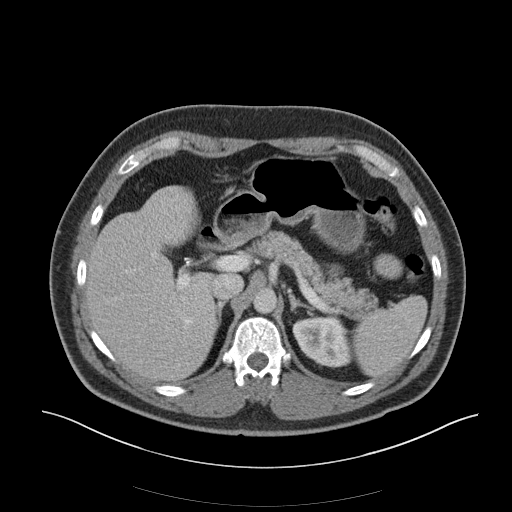
[im 103/123  soft-tissue]
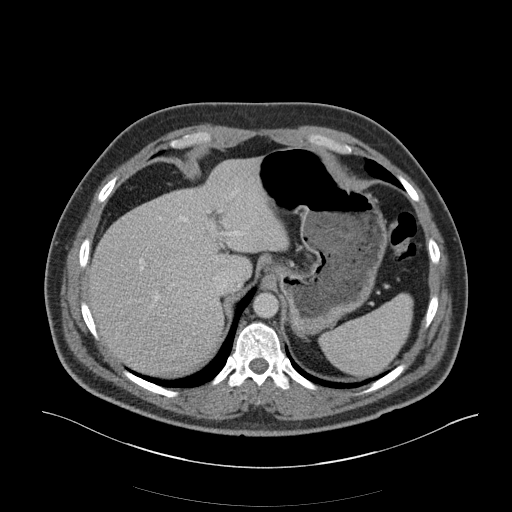
[im 116/123  soft-tissue]
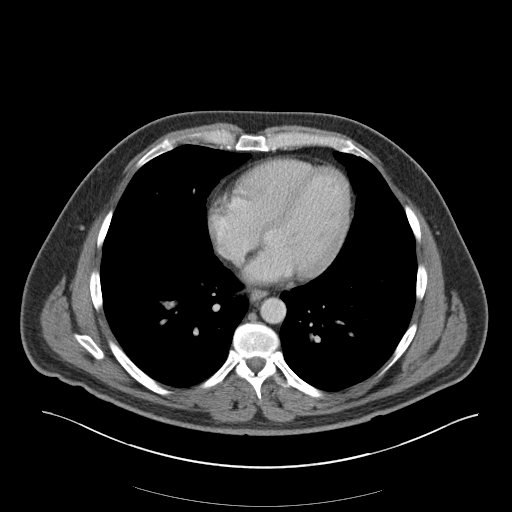

[Series 5: coronal st · coronal · 0.80mm/px · 3 of 157 slices shown]
[im 53/157  soft-tissue]
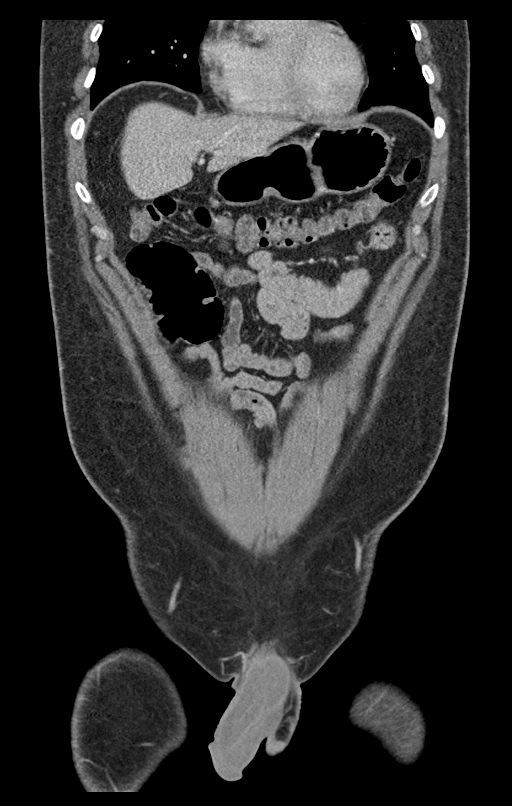
[im 70/157  soft-tissue]
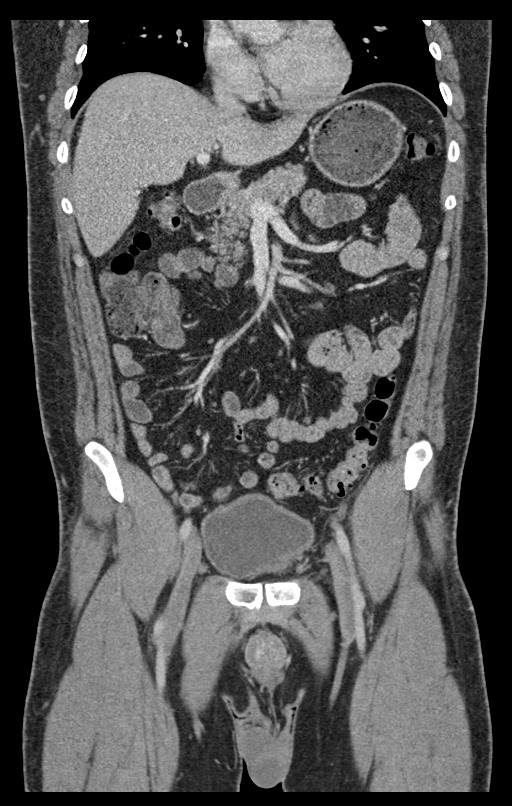
[im 87/157  soft-tissue]
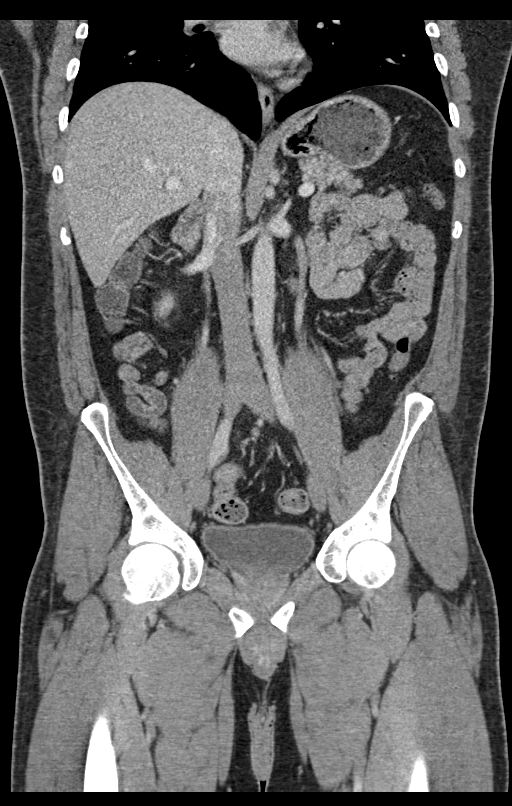

[15 of 46 positions shown; findings below may reference images not displayed]

FINDINGS: Lower chest: The visualized lung bases are clear.

No intra-abdominal free air or free fluid

Hepatobiliary: The liver is unremarkable. No intrahepatic biliary
ductal dilatation. Cholecystectomy.

Pancreas: Unremarkable. No pancreatic ductal dilatation or
surrounding inflammatory changes.

Spleen: Normal in size without focal abnormality.

Adrenals/Urinary Tract: The adrenal glands unremarkable. The
kidneys, visualized ureters, and urinary bladder appear
unremarkable.

Stomach/Bowel: Several small scattered sigmoid diverticula without
active inflammatory changes. There is loose stool throughout the
colon compatible with diarrheal state. Correlation with clinical
exam and stool cultures recommended. There is a 3 cm distal duodenal
diverticula without active inflammatory changes. There is no bowel
obstruction. The appendix is not visualized with certainty. No
inflammatory changes identified in the right lower quadrant.

Vascular/Lymphatic: The abdominal aorta and IVC unremarkable. No
portal venous gas. There is no adenopathy.

Reproductive: The prostate and seminal vesicles are grossly
unremarkable.

Other: None

Musculoskeletal: No acute or significant osseous findings.
IMPRESSION: 1. Diarrheal state. Correlation with clinical exam and stool
cultures recommended. No bowel obstruction.
2. A 3 cm distal duodenal diverticula without active inflammatory
changes.
